# Patient Record
Sex: Female | Born: 2000
Health system: Southern US, Community
[De-identification: ages and names within clinical notes are randomized; demographics above are authoritative.]

## PROBLEM LIST (undated history)

## (undated) DIAGNOSIS — D649 Anemia, unspecified: Secondary | ICD-10-CM

## (undated) HISTORY — DX: Anemia, unspecified: D64.9

---
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
## 2021-01-01 ENCOUNTER — Emergency Department (HOSPITAL_BASED_OUTPATIENT_CLINIC_OR_DEPARTMENT_OTHER): Payer: Managed Care, Other (non HMO)

## 2021-01-01 ENCOUNTER — Emergency Department (HOSPITAL_BASED_OUTPATIENT_CLINIC_OR_DEPARTMENT_OTHER)
Admission: EM | Admit: 2021-01-01 | Discharge: 2021-01-01 | Disposition: A | Discharge status: Home / Self Care | Payer: Managed Care, Other (non HMO) | Attending: Student | Admitting: Student

## 2021-01-01 ENCOUNTER — Encounter (HOSPITAL_BASED_OUTPATIENT_CLINIC_OR_DEPARTMENT_OTHER): Payer: Self-pay

## 2021-01-01 ENCOUNTER — Other Ambulatory Visit: Payer: Self-pay

## 2021-01-01 DIAGNOSIS — R112 Nausea with vomiting, unspecified: Secondary | ICD-10-CM | POA: Insufficient documentation

## 2021-01-01 DIAGNOSIS — N9489 Other specified conditions associated with female genital organs and menstrual cycle: Secondary | ICD-10-CM | POA: Diagnosis not present

## 2021-01-01 DIAGNOSIS — R1030 Lower abdominal pain, unspecified: Secondary | ICD-10-CM | POA: Diagnosis not present

## 2021-01-01 DIAGNOSIS — Z20822 Contact with and (suspected) exposure to covid-19: Secondary | ICD-10-CM | POA: Insufficient documentation

## 2021-01-01 DIAGNOSIS — R509 Fever, unspecified: Secondary | ICD-10-CM | POA: Diagnosis not present

## 2021-01-01 DIAGNOSIS — R519 Headache, unspecified: Secondary | ICD-10-CM | POA: Diagnosis not present

## 2021-01-01 LAB — URINALYSIS, ROUTINE W REFLEX MICROSCOPIC
Bilirubin Urine: NEGATIVE
Glucose, UA: NEGATIVE mg/dL
Hgb urine dipstick: NEGATIVE
Ketones, ur: NEGATIVE mg/dL
Leukocytes,Ua: NEGATIVE
Nitrite: NEGATIVE
Protein, ur: NEGATIVE mg/dL
Specific Gravity, Urine: 1.015 (ref 1.005–1.030)
pH: 6.5 (ref 5.0–8.0)

## 2021-01-01 LAB — CBC WITH DIFFERENTIAL/PLATELET
Abs Immature Granulocytes: 0.02 10*3/uL (ref 0.00–0.07)
Basophils Absolute: 0 10*3/uL (ref 0.0–0.1)
Basophils Relative: 0 %
Eosinophils Absolute: 0 10*3/uL (ref 0.0–0.5)
Eosinophils Relative: 0 %
HCT: 34 % — ABNORMAL LOW (ref 36.0–46.0)
Hemoglobin: 10.7 g/dL — ABNORMAL LOW (ref 12.0–15.0)
Immature Granulocytes: 0 %
Lymphocytes Relative: 38 %
Lymphs Abs: 2.9 10*3/uL (ref 0.7–4.0)
MCH: 23.6 pg — ABNORMAL LOW (ref 26.0–34.0)
MCHC: 31.5 g/dL (ref 30.0–36.0)
MCV: 75.1 fL — ABNORMAL LOW (ref 80.0–100.0)
Monocytes Absolute: 0.4 10*3/uL (ref 0.1–1.0)
Monocytes Relative: 5 %
Neutro Abs: 4.4 10*3/uL (ref 1.7–7.7)
Neutrophils Relative %: 57 %
Platelets: 171 10*3/uL (ref 150–400)
RBC: 4.53 MIL/uL (ref 3.87–5.11)
RDW: 13.6 % (ref 11.5–15.5)
WBC: 7.7 10*3/uL (ref 4.0–10.5)
nRBC: 0 % (ref 0.0–0.2)

## 2021-01-01 LAB — COMPREHENSIVE METABOLIC PANEL
ALT: 15 U/L (ref 0–44)
AST: 17 U/L (ref 15–41)
Albumin: 5 g/dL (ref 3.5–5.0)
Alkaline Phosphatase: 57 U/L (ref 38–126)
Anion gap: 8 (ref 5–15)
BUN: 13 mg/dL (ref 6–20)
CO2: 26 mmol/L (ref 22–32)
Calcium: 9.7 mg/dL (ref 8.9–10.3)
Chloride: 105 mmol/L (ref 98–111)
Creatinine, Ser: 0.55 mg/dL (ref 0.44–1.00)
GFR, Estimated: >60 mL/min (ref >60)
Glucose, Bld: 92 mg/dL (ref 70–99)
Potassium: 4 mmol/L (ref 3.5–5.1)
Sodium: 139 mmol/L (ref 135–145)
Total Bilirubin: 0.3 mg/dL (ref 0.3–1.2)
Total Protein: 8.3 g/dL — ABNORMAL HIGH (ref 6.5–8.1)

## 2021-01-01 LAB — RESP PANEL BY RT-PCR (FLU A&B, COVID) ARPGX2
Influenza A by PCR: NEGATIVE
Influenza B by PCR: NEGATIVE
SARS Coronavirus 2 by RT PCR: NEGATIVE

## 2021-01-01 LAB — HCG, SERUM, QUALITATIVE: Preg, Serum: NEGATIVE

## 2021-01-01 MED ORDER — IOHEXOL 350 MG/ML SOLN
75.0000 mL | Freq: Once | INTRAVENOUS | Status: AC | PRN
  Administered 2021-01-01: 75 mL via INTRAVENOUS

## 2021-01-01 MED ORDER — DIPHENHYDRAMINE HCL 50 MG/ML IJ SOLN
25.0000 mg | Freq: Once | INTRAMUSCULAR | Status: AC
  Administered 2021-01-01: 25 mg via INTRAVENOUS
  Filled 2021-01-01: qty 1

## 2021-01-01 MED ORDER — SODIUM CHLORIDE 0.9 % IV BOLUS
1000.0000 mL | Freq: Once | INTRAVENOUS | Status: AC
  Administered 2021-01-01: 1000 mL via INTRAVENOUS

## 2021-01-01 MED ORDER — PROMETHAZINE HCL 25 MG/ML IJ SOLN
INTRAMUSCULAR | Status: AC
  Filled 2021-01-01: qty 1

## 2021-01-01 MED ORDER — SODIUM CHLORIDE 0.9 % IV SOLN
12.5000 mg | Freq: Four times a day (QID) | INTRAVENOUS | Status: DC | PRN
  Administered 2021-01-01: 12.5 mg via INTRAVENOUS
  Filled 2021-01-01: qty 0.5

## 2021-01-01 NOTE — Discharge Instructions (Addendum)
Your laboratory results are within normal limits today.  You had a CT of your head and neck which did not show any intracranial pathology.  Follow-up with your primary care physician as needed.

## 2021-01-01 NOTE — ED Notes (Signed)
Per EMS:  pt from work, pt has HA since Monday, worse last night.  Sensitive to light, no history of migraine.  Pain is intermittent,  all over her head.  Some N/V.

## 2021-01-01 NOTE — ED Provider Notes (Addendum)
[facility] EMERGENCY DEPARTMENT Provider Note   CSN: [id number] Arrival date & time: 01/01/21  1002     History Chief Complaint  Patient presents with   Headache   Emesis    Karina Horne is a 20 y.o. female.  20 y.o female with no medical history presents to the ED with a chief complaint of headache which has been ongoing for the past 4 days.  Describes the headaches occur when she arrives at work however worsen when she arrived at home. The headache is described as a generalized throbbing that has been on/off. She tried taking some medication "I took it at work I don't know the name", which did not help with symptoms.  In addition, she does report 2 episodes of nonbilious, nonbloody emesis.  She is also endorsing some lower abdominal pain, along with some nausea.  Denies any diarrhea.  She denies any urinary symptoms, vaginal discharge. Of note, patient does report her father does have a history of aneurysms.  Last menstrual period around September, does have a history of irregular periods.  Currently sexually active with no birth control.     The history is provided by the patient.  Headache Pain location:  Generalized Quality:  Sharp Radiates to:  Does not radiate Associated symptoms: abdominal pain, fever, nausea and vomiting   Emesis Associated symptoms: abdominal pain, fever and headaches   Associated symptoms: no chills       History reviewed. No pertinent past medical history.  Patient Active Problem List   Diagnosis Date Noted   Non-intractable vomiting 01/26/2019   Periumbilical abdominal pain 01/26/2019   Weight loss 01/26/2019   Oral contraceptive use 04/24/2018   Elevated blood-pressure reading without diagnosis of hypertension 05/21/2017   Irregular menstrual bleeding 05/21/2017    History reviewed. No pertinent surgical history.   OB History     Gravida  0   Para  0   Term  0   Preterm  0   AB  0   Living  0      SAB  0    IAB  0   Ectopic  0   Multiple  0   Live Births  0           No family history on file.  Social History   Tobacco Use   Smoking status: Never   Smokeless tobacco: Never  Vaping Use   Vaping Use: Every day   Substances: Nicotine  Substance Use Topics   Alcohol use: Not Currently   Drug use: Yes    Frequency: 7.0 times per week    Types: Marijuana    Home Medications Prior to Admission medications   Medication Sig Start Date End Date Taking? Authorizing Provider  clindamycin-benzoyl peroxide (BENZACLIN) gel APPLY TOPICALLY TO AFFECTED AREA ONCE DAILY. 07/23/18   [provider]    Allergies    Amoxicillin  Review of Systems   Review of Systems  Constitutional:  Positive for fever. Negative for chills.  Respiratory:  Negative for shortness of breath.   Gastrointestinal:  Positive for abdominal pain, nausea and vomiting.  Genitourinary:  Negative for hematuria, vaginal bleeding and vaginal discharge.  Neurological:  Positive for headaches.   Physical Exam Updated Vital Signs BP 108/82 (BP Location: Right Arm)   Pulse 92   Temp 98.4 F (36.9 C) (Oral)   Resp 16   Ht 5\' 3"  (1.6 m)   Wt 49.9 kg   SpO2 99%   BMI  19.49 kg/m   Physical Exam Vitals and nursing note reviewed.  Constitutional:      Appearance: She is well-developed. She is not ill-appearing.  HENT:     Head: Normocephalic and atraumatic.  Eyes:     Extraocular Movements: Extraocular movements intact.  Cardiovascular:     Rate and Rhythm: Normal rate.  Pulmonary:     Effort: Pulmonary effort is normal.     Breath sounds: No wheezing.  Abdominal:     Palpations: Abdomen is soft.     Tenderness: There is no abdominal tenderness.  Musculoskeletal:     Cervical back: Normal range of motion and neck supple.  Skin:    General: Skin is warm.  Neurological:     Mental Status: She is alert and oriented to person, place, and time.     Comments: Alert, oriented, thought content  appropriate. Speech fluent without evidence of aphasia. Able to follow 2 step commands without difficulty.  Cranial Nerves:  II:  Peripheral visual fields grossly normal, pupils, round, reactive to light III,IV, VI: ptosis not present, extra-ocular motions intact bilaterally  V,VII: smile symmetric, facial light touch sensation equal VIII: hearing grossly normal bilaterally  IX,X: midline uvula rise  XI: bilateral shoulder shrug equal and strong XII: midline tongue extension  Motor:  5/5 in upper and lower extremities bilaterally including strong and equal grip strength and dorsiflexion/plantar flexion Sensory: light touch normal in all extremities.  Cerebellar: normal finger-to-nose with bilateral upper extremities, pronator drift negative Gait: normal gait and balance      ED Results / Procedures / Treatments   Labs (all labs ordered are listed, but only abnormal results are displayed) Labs Reviewed  CBC WITH DIFFERENTIAL/PLATELET - Abnormal; Notable for the following components:      Result Value   Hemoglobin 10.7 (*)    HCT 34.0 (*)    MCV 75.1 (*)    MCH 23.6 (*)    All other components within normal limits  COMPREHENSIVE METABOLIC PANEL - Abnormal; Notable for the following components:   Total Protein 8.3 (*)    All other components within normal limits  RESP PANEL BY RT-PCR (FLU A&B, COVID) ARPGX2  HCG, SERUM, QUALITATIVE  URINALYSIS, ROUTINE W REFLEX MICROSCOPIC    EKG None  Radiology CT ANGIO HEAD NECK W WO CM  Result Date: 01/01/2021 CLINICAL DATA:  Headache, family history of aneurysms EXAM: CT ANGIOGRAPHY HEAD AND NECK TECHNIQUE: Multidetector CT imaging of the head and neck was performed using the standard protocol during bolus administration of intravenous contrast. Multiplanar CT image reconstructions and MIPs were obtained to evaluate the vascular anatomy. Carotid stenosis measurements (when applicable) are obtained utilizing NASCET criteria, using the  distal internal carotid diameter as the denominator. CONTRAST:  52mL OMNIPAQUE IOHEXOL 350 MG/ML SOLN COMPARISON:  None. FINDINGS: CT HEAD FINDINGS Brain: There is no acute intracranial hemorrhage, extra-axial fluid collection, or acute infarct. Parenchymal volume is normal. The ventricles are normal in size. There is no mass lesion. There is no midline shift. Vascular: No hyperdense vessel or unexpected calcification. Skull: Normal. Negative for fracture or focal lesion. Sinuses and orbits: The paranasal sinuses are clear. The globes and orbits are unremarkable. Review of the MIP images confirms the above findings CTA NECK FINDINGS Aortic arch: Normal. Right carotid system: The right common, internal, and external carotid arteries are patent, without hemodynamically significant stenosis, occlusion, dissection, or aneurysm. There is a tonsillar loop of the right internal carotid artery. Left carotid system: The left common,  internal, and external carotid arteries are patent, without hemodynamically significant stenosis, occlusion, dissection, or aneurysm. Vertebral arteries: The right vertebral artery is dominant, a normal variant. The vertebral arteries are patent, without hemodynamically significant stenosis, occlusion, dissection, or aneurysm. Skeleton: The bones are normal. Other neck: The soft tissues are normal. Upper chest: The lung apices are clear. Review of the MIP images confirms the above findings CTA HEAD FINDINGS Anterior circulation: The intracranial ICAs are patent. The bilateral MCAs are patent. The bilateral ACAs are patent. There is no aneurysm. Posterior circulation: The bilateral V4 segments are patent. The basilar artery is patent. The bilateral PCAs are patent. The left posterior communicating artery is not definitely identified Venous sinuses: Patent. Anatomic variants: As above. Review of the MIP images confirms the above findings IMPRESSION: 1. No acute intracranial pathology. 2. Normal  vasculature of the head and neck; no significant stenosis, occlusion, dissection, or aneurysm. Electronically Signed   By: Lesia Hausen M.D.   On: 01/01/2021 16:22    Procedures Procedures   Medications Ordered in ED Medications  promethazine (PHENERGAN) 12.5 mg in sodium chloride 0.9 % 50 mL IVPB (0 mg Intravenous Stopped 01/01/21 1541)  promethazine (PHENERGAN) 25 MG/ML injection (has no administration in time range)  sodium chloride 0.9 % bolus 1,000 mL (0 mLs Intravenous Stopped 01/01/21 1639)  diphenhydrAMINE (BENADRYL) injection 25 mg (25 mg Intravenous Given 01/01/21 1520)  iohexol (OMNIPAQUE) 350 MG/ML injection 75 mL (75 mLs Intravenous Contrast Given 01/01/21 1539)    ED Course  I have reviewed the triage vital signs and the nursing notes.  Pertinent labs & imaging results that were available during my care of the patient were reviewed by me and considered in my medical decision making (see chart for details).    MDM Rules/Calculators/A&P   Patient presents to the ED with a chief complaint of headache of sudden onset in the last few days, describing as a throbbing sensation throughout her whole head, does have a family history of aneurysms.  Reports no improvement with over-the-counter medication.  In addition she endorses vomiting, had 2 episodes of this prior to arrival.  Along with a low-grade fever.  She did obtain respiratory panel on triage, negative for influenza, negative for COVID-19.  In addition, she does report generalized abdominal pain but mostly along the lower abdomen without any urinary or vaginal discharge.  UA is without any nitrites, no leukocytes or signs of infection.  Beta hCG is negative.  CMP without any electrolyte derangement, creatinine levels within normal limits.  CT Angio head and neck showed:  1. No acute intracranial pathology.  2. Normal vasculature of the head and neck; no significant stenosis,  occlusion, dissection, or aneurysm.          Patient is requesting discharge at this time as she reports resolution in headache.  I discussed with her that her CT findings.  Of note, patient does report she recently got placed on doxycycline, or her symptoms began after she started taking this medication.  She has been tolerating p.o. after fluids and has had some resolution in her headache.  She is currently on doxycycline for a history of chlamydia, I did talk to patient about having medication changed by PCP.  She is agreeable of this at this time. Encouraged to follow-up with PCP as needed.  Patient is agreeable of discharge at this time.   Portions of this note were generated with Scientist, clinical (histocompatibility and immunogenetics). Dictation errors may occur despite best attempts at proofreading.  Final Clinical Impression(s) / ED Diagnoses Final diagnoses:  Nausea and vomiting, unspecified vomiting type    Rx / DC Orders ED Discharge Orders     None        Claude Manges, PA-C 01/01/21 1655    Claude Manges, PA-C 01/01/21 1702    [place], [place], MD 01/02/21 [phone]

## 2021-01-01 NOTE — ED Triage Notes (Addendum)
Pt arrives [facility] from work with c/o headache since Monday states she has not taken anything at home for headache. Pt also c/o nausea and vomiting starting on Tuesday. Also body aches and chills starting today.

## 2021-01-13 ENCOUNTER — Ambulatory Visit: Payer: Managed Care, Other (non HMO) | Admitting: Family Medicine

## 2021-01-13 ENCOUNTER — Other Ambulatory Visit: Payer: Self-pay

## 2021-01-13 VITALS — BP 110/64 | HR 97 | Temp 97.8°F | Ht 63.0 in | Wt 108.6 lb

## 2021-01-13 DIAGNOSIS — Z113 Encounter for screening for infections with a predominantly sexual mode of transmission: Secondary | ICD-10-CM | POA: Diagnosis not present

## 2021-01-13 DIAGNOSIS — N926 Irregular menstruation, unspecified: Secondary | ICD-10-CM

## 2021-01-13 DIAGNOSIS — D509 Iron deficiency anemia, unspecified: Secondary | ICD-10-CM | POA: Diagnosis not present

## 2021-01-13 MED ORDER — FERROUS SULFATE 325 (65 FE) MG PO TBEC: 325.0000 mg | DELAYED_RELEASE_TABLET | Freq: Two times a day (BID) | ORAL | 3 refills | Status: DC

## 2021-01-13 NOTE — Progress Notes (Signed)
[facility] [address] [place] [address] Dept: [phone] Dept Fax: [phone]  Office Visit  Subjective:    Patient ID: Karina Horne, female    DOB: October 04, 2000, 20 y.o..   MRN: [id number]  Chief Complaint  Patient presents with   Acute Visit    Wants STD testing.  Also c/o having abnormal periods and low iron.      History of Present Illness:  Patient is in today requesting screening for STDs. Karina Horne has recently found evidence that her boyfriend was not exclusively having sex with her. They had reached a point where they were no longer using condoms. She has now boken up with her boyfriend, but requests screening to know about possible infection.  Karina Horne had recently been seen by Karina Horne. She was found to be anemic on her blood evaluation. She is not sure about what iron to take for this.  Karina Horne does note an issue with menstrual irregularity. She does not feel that she ahs heavy periods. She notes she was previously on COCs, but that her periods remained irregular. Earlier this year, she went on Depo-Provera. She also did not see improvement in her menstrual cycles with this, so now has been more than 3 months since her last shot. She notes her grandmotehr told her that she needs to have a period every month.  Past Medical History: Patient Active Problem List   Diagnosis Date Noted   Non-intractable vomiting [id number]   Periumbilical abdominal pain 01/26/2019   Weight loss 01/26/2019   Oral contraceptive use 04/24/2018   Elevated blood-pressure reading without diagnosis of hypertension 05/21/2017   Irregular menstrual bleeding 05/21/2017   No past surgical history on file.  No family history on file.  Outpatient Medications Prior to Visit  Medication Sig Dispense Refill   clindamycin-benzoyl peroxide (BENZACLIN) gel APPLY TOPICALLY TO AFFECTED AREA ONCE DAILY.     No  facility-administered medications prior to visit.   Allergies  Allergen Reactions   Amoxicillin     Objective:   Today's Vitals   01/13/21 1507  BP: 110/64  Pulse: 97  Temp: 97.8 F (36.6 C)  TempSrc: Temporal  SpO2: 98%  Weight: 108 lb 9.6 oz (49.3 kg)  Height: 5\' 3"  (1.6 m)   Body mass index is 19.24 kg/m.   General: Well developed, well nourished. No acute distress. Psych: Alert and oriented x3. Normal mood and affect.  Health Maintenance Due  Topic Date Due   HPV VACCINES (1 - 2-dose series) Never done   CHLAMYDIA SCREENING  Never done   COVID-19 Vaccine (3 - Booster for Pfizer series) 09/25/2019   Lab Results CBC Latest Ref Rng & Units 01/01/2021 11/26/2020 01/17/2019  WBC 4.0 - 10.5 K/uL 7.7 4.7 9.0  Hemoglobin 12.0 - 15.0 g/dL 10.7(L) 10.3(L) 10.1(L)  Hematocrit 36.0 - 46.0 % 34.0(L) 32.5(L) 33.1(L)  Platelets 150 - 400 K/uL 171 140.0(L) 171     Assessment & Plan:   1. Screen for STD (sexually transmitted disease) I will order screening labs for STDs. We did discuss the recommendation to repeat an HIV test in 3-6 months to be sure it truly is negative.  - Chlamydia/GC NAA, Confirmation - Hepatitis B surface antigen - HIV Antibody (routine testing w rflx) - RPR  2. Microcytic anemia Karina Horne has persistent microcytic anemia. I will check her iron profile. I will also prescribe iron replacement. I recommend she reassess this in 6 weeks. If not  improving, she may need an iron infusion.  - Iron, TIBC and Ferritin Panel - ferrous sulfate 325 (65 FE) MG EC tablet; Take 1 tablet (325 mg total) by mouth 2 (two) times daily with a meal.  Dispense: 60 tablet; Refill: 3  3. Menstrual irregularity We discussed various options for birth control and how they might impact her menses. I did discuss the option of continuous birth control, possibly having a menses every 3 months. This could reduce her menstrual blood loss and help her correct her anemia. She will give  some thoguht to this and follow-up with Karina Horne.  Loyola Mast, MD

## 2021-01-14 LAB — HEPATITIS B SURFACE ANTIGEN: Hepatitis B Surface Ag: NONREACTIVE

## 2021-01-14 LAB — RPR: RPR Ser Ql: NONREACTIVE

## 2021-01-14 LAB — IRON,TIBC AND FERRITIN PANEL
%SAT: 30 % (calc) (ref 15–45)
Ferritin: 109 ng/mL (ref 16–154)
Iron: 99 ug/dL (ref 27–164)
TIBC: 325 mcg/dL (calc) (ref 271–448)

## 2021-01-14 LAB — HIV ANTIBODY (ROUTINE TESTING W REFLEX): HIV 1&2 Ab, 4th Generation: NONREACTIVE

## 2021-01-16 LAB — CHLAMYDIA/GC NAA, CONFIRMATION
Chlamydia trachomatis, NAA: NEGATIVE
Neisseria gonorrhoeae, NAA: NEGATIVE

## 2021-02-24 ENCOUNTER — Ambulatory Visit: Payer: Managed Care, Other (non HMO) | Admitting: Nurse Practitioner

## 2021-03-25 ENCOUNTER — Ambulatory Visit: Payer: Managed Care, Other (non HMO) | Admitting: Nurse Practitioner

## 2021-03-25 ENCOUNTER — Encounter: Payer: Self-pay | Admitting: Nurse Practitioner

## 2021-03-25 ENCOUNTER — Other Ambulatory Visit: Payer: Self-pay

## 2021-03-25 VITALS — BP 98/60 | HR 80 | Temp 97.3°F | Ht 63.0 in | Wt 110.8 lb

## 2021-03-25 DIAGNOSIS — R5382 Chronic fatigue, unspecified: Secondary | ICD-10-CM

## 2021-03-25 DIAGNOSIS — D509 Iron deficiency anemia, unspecified: Secondary | ICD-10-CM

## 2021-03-25 MED ORDER — FERROUS SULFATE 325 (65 FE) MG PO TBEC: 325.0000 mg | DELAYED_RELEASE_TABLET | Freq: Every day | ORAL | 1 refills | Status: AC

## 2021-03-25 NOTE — Assessment & Plan Note (Signed)
No use of ferrous sulfate at this time. She is concerned about cost and need for medication Denies any menorrhagia: LMP 03/10/2021, cycle every 28-30days, last 4days (heavy for first 2days, change every 3hrs with regular pad), no clots, mild dysmenorrhea on first 2days which resolves with ibuprofen. CBC Latest Ref Rng & Units 01/01/2021 11/26/2020 01/17/2019  WBC 4.0 - 10.5 K/uL 7.7 4.7 9.0  Hemoglobin 12.0 - 15.0 g/dL 10.7(L) 10.3(L) 10.1(L)  Hematocrit 36.0 - 46.0 % 34.0(L) 32.5(L) 33.1(L)  Platelets 150 - 400 K/uL 171 140.0(L) 171   Iron/TIBC/Ferritin/ %Sat    Component Value Date/Time   IRON 99 01/13/2021 1528   TIBC 325 01/13/2021 1528   FERRITIN 109 01/13/2021 1528   IRONPCTSAT 30 01/13/2021 1528   Advise to use prenatal with iron 1tab daily or ferrous sulfate 1tab daily Repeat cbc and iron panel, check SS and thalessemia panel, since normal iron panel.

## 2021-03-25 NOTE — Patient Instructions (Signed)
Schedule lab appt for repeat anemia panel in 7month.  Resume iron supplement 1tab daily

## 2021-03-25 NOTE — Progress Notes (Signed)
Subjective:  Patient ID: Karina Horne, female    DOB: 2000-09-22  Age: 21 y.o. MRN: [id number]  CC: Follow-up (6 week f/u on Anemia/Pt states she has stopped her iron medication and wants to discuss this today. )   HPI  Microcytic anemia No use of ferrous sulfate at this time. She is concerned about cost and need for medication Denies any menorrhagia: LMP 03/10/2021, cycle every 28-30days, last 4days (heavy for first 2days, change every 3hrs with regular pad), no clots, mild dysmenorrhea on first 2days which resolves with ibuprofen. CBC Latest Ref Rng & Units 01/01/2021 11/26/2020 01/17/2019  WBC 4.0 - 10.5 K/uL 7.7 4.7 9.0  Hemoglobin 12.0 - 15.0 g/dL 10.7(L) 10.3(L) 10.1(L)  Hematocrit 36.0 - 46.0 % 34.0(L) 32.5(L) 33.1(L)  Platelets 150 - 400 K/uL 171 140.0(L) 171   Iron/TIBC/Ferritin/ %Sat    Component Value Date/Time   IRON 99 01/13/2021 1528   TIBC 325 01/13/2021 1528   FERRITIN 109 01/13/2021 1528   IRONPCTSAT 30 01/13/2021 1528   Advise to use prenatal with iron 1tab daily or ferrous sulfate 1tab daily Repeat cbc and iron panel, check SS and thalessemia panel, since normal iron panel.  She also reports persistent fatigue despite adequate sleep. She sleepns 6-7hrs, feels rested, Minimal caffeine intake (2cups of ice tea)  Wt Readings from Last 3 Encounters:  03/25/21 110 lb 12.8 oz (50.3 kg) (17 %, Z= -0.94)*  01/13/21 108 lb 9.6 oz (49.3 kg) (14 %, Z= -1.07)*  01/01/21 110 lb (49.9 kg) (17 %, Z= -0.97)*   * Growth percentiles are based on CDC (Girls, 2-20 Years) data.    Reviewed past Medical, Social and Family history today.  Outpatient Medications Prior to Visit  Medication Sig Dispense Refill   clindamycin-benzoyl peroxide (BENZACLIN) gel APPLY TOPICALLY TO AFFECTED AREA ONCE DAILY.     ferrous sulfate 325 (65 FE) MG EC tablet Take 1 tablet (325 mg total) by mouth 2 (two) times daily with a meal. 60 tablet 3   No facility-administered medications prior to  visit.    ROS See HPI  Objective:  BP 98/60 (BP Location: Left Arm, Patient Position: Sitting, Cuff Size: Normal)    Pulse 80    Temp (!) 97.3 F (36.3 C) (Temporal)    Ht 5\' 3"  (1.6 m)    Wt 110 lb 12.8 oz (50.3 kg)    SpO2 98%    BMI 19.63 kg/m   Physical Exam Cardiovascular:     Rate and Rhythm: Normal rate.     Pulses: Normal pulses.  Pulmonary:     Effort: Pulmonary effort is normal.  Neurological:     Mental Status: She is alert and oriented to person, place, and time.    Assessment & Plan:  This visit occurred during the SARS-CoV-2 public health emergency.  Safety protocols were in place, including screening questions prior to the visit, additional usage of staff PPE, and extensive cleaning of exam room while observing appropriate contact time as indicated for disinfecting solutions.   Karina Horne was seen today for follow-up.  Diagnoses and all orders for this visit:  Chronic fatigue -     TSH; Future  Microcytic anemia -     ferrous sulfate 325 (65 FE) MG EC tablet; Take 1 tablet (325 mg total) by mouth daily with breakfast. -     CBC w/Diff; Future -     Iron, TIBC and Ferritin Panel; Future -     Sickle cell screen; Future -  Thalassemia and Hemoglobinopathy Comprehensive Evaluation; Future   Problem List Items Addressed This Visit       Other   Microcytic anemia    No use of ferrous sulfate at this time. She is concerned about cost and need for medication Denies any menorrhagia: LMP 03/10/2021, cycle every 28-30days, last 4days (heavy for first 2days, change every 3hrs with regular pad), no clots, mild dysmenorrhea on first 2days which resolves with ibuprofen. CBC Latest Ref Rng & Units 01/01/2021 11/26/2020 01/17/2019  WBC 4.0 - 10.5 K/uL 7.7 4.7 9.0  Hemoglobin 12.0 - 15.0 g/dL 10.7(L) 10.3(L) 10.1(L)  Hematocrit 36.0 - 46.0 % 34.0(L) 32.5(L) 33.1(L)  Platelets 150 - 400 K/uL 171 140.0(L) 171   Iron/TIBC/Ferritin/ %Sat    Component Value Date/Time    IRON 99 01/13/2021 1528   TIBC 325 01/13/2021 1528   FERRITIN 109 01/13/2021 1528   IRONPCTSAT 30 01/13/2021 1528   Advise to use prenatal with iron 1tab daily or ferrous sulfate 1tab daily Repeat cbc and iron panel, check SS and thalessemia panel, since normal iron panel.      Relevant Medications   ferrous sulfate 325 (65 FE) MG EC tablet   Other Relevant Orders   CBC w/Diff   Iron, TIBC and Ferritin Panel   Sickle cell screen   Thalassemia and Hemoglobinopathy Comprehensive Evaluation   Other Visit Diagnoses     Chronic fatigue    -  Primary   Relevant Orders   TSH       Follow-up: Return in about 8 months (around 11/23/2021) for CPE (fasting).  Wilfred Lacy, NP

## 2021-05-29 ENCOUNTER — Ambulatory Visit: Payer: Managed Care, Other (non HMO) | Admitting: Nurse Practitioner

## 2021-05-29 ENCOUNTER — Other Ambulatory Visit: Payer: Self-pay

## 2021-05-29 ENCOUNTER — Encounter: Payer: Self-pay | Admitting: Nurse Practitioner

## 2021-05-29 ENCOUNTER — Other Ambulatory Visit (HOSPITAL_COMMUNITY)
Admission: RE | Admit: 2021-05-29 | Discharge: 2021-05-29 | Disposition: A | Discharge status: Home / Self Care | Payer: Managed Care, Other (non HMO) | Source: Ambulatory Visit | Attending: Nurse Practitioner | Admitting: Nurse Practitioner

## 2021-05-29 VITALS — BP 100/62 | HR 111 | Temp 97.1°F | Ht 64.75 in | Wt 107.4 lb

## 2021-05-29 DIAGNOSIS — N898 Other specified noninflammatory disorders of vagina: Secondary | ICD-10-CM | POA: Insufficient documentation

## 2021-05-29 DIAGNOSIS — N760 Acute vaginitis: Secondary | ICD-10-CM

## 2021-05-29 DIAGNOSIS — R300 Dysuria: Secondary | ICD-10-CM

## 2021-05-29 LAB — POCT URINALYSIS DIPSTICK
Bilirubin, UA: NEGATIVE
Blood, UA: NEGATIVE
Glucose, UA: NEGATIVE
Ketones, UA: NEGATIVE
Nitrite, UA: NEGATIVE
Protein, UA: POSITIVE — AB
Spec Grav, UA: 1.015 (ref 1.010–1.025)
Urobilinogen, UA: NEGATIVE E.U./dL — AB
pH, UA: 6 (ref 5.0–8.0)

## 2021-05-29 NOTE — Patient Instructions (Signed)
Maintain adequate oral hydration ?Ok to use AZO from over the counter for dysuria ?You will be contacted with lab results ?

## 2021-05-29 NOTE — Progress Notes (Signed)
? ?Acute Office Visit ? ?Subjective:  ? ? Patient ID: Karina Horne, female    DOB: [birth year], 21 y.o.   MRN: [id number] ? ?Chief Complaint  ?Patient presents with  ? Acute Visit  ?  Pt c/o burning sensation, odor, and pain when urinating x 3-4 days. Denies any discharge ?Pt has not tried any otc medications.   ? ?HPI ?Patient is in today for dysuria and vaginal odor. Onset 4days ago. No abnormal discharge, but odor is prominent during intercourse. No dyspareunia. Onset of dysuria after intercourse. ?No back pain, no ABD/pelvic pain, no fever ?No hx of recurrent UTI or STI ? ?Outpatient Medications Prior to Visit  ?Medication Sig  ? ferrous sulfate 325 (65 FE) MG EC tablet Take 1 tablet (325 mg total) by mouth daily with breakfast.  ? [DISCONTINUED] clindamycin-benzoyl peroxide (BENZACLIN) gel APPLY TOPICALLY TO AFFECTED AREA ONCE DAILY. (Patient not taking: Reported on 05/29/2021)  ? ?No facility-administered medications prior to visit.  ? ?Reviewed past medical and social history.  ?Review of Systems Per HPI ? ?   ?Objective:  ?  ?Physical Exam ?Exam conducted with a chaperone present.  ?Abdominal:  ?   Hernia: There is no hernia in the left inguinal area or right inguinal area.  ?Genitourinary: ?   General: Normal vulva.  ?   Exam position: Lithotomy position.  ?   Labia:     ?   Right: No rash, tenderness or lesion.     ?   Left: No rash, tenderness or lesion.   ?   Vagina: Vaginal discharge present. No erythema or tenderness.  ?   Cervix: Discharge present. No cervical motion tenderness, friability, lesion or erythema.  ?Lymphadenopathy:  ?   Lower Body: No right inguinal adenopathy. No left inguinal adenopathy.  ?Neurological:  ?   Mental Status: She is alert.  ? ?BP 100/62 (BP Location: Left Arm, Patient Position: Sitting, Cuff Size: Normal)   Pulse (!) 111   Temp (!) 97.1 ?F (36.2 ?C) (Temporal)   Ht 5' 4.75" (1.645 m)   Wt 107 lb 6.4 oz (48.7 kg)   SpO2 99%   BMI 18.01 kg/m?  ? ? ?Results for orders  placed or performed in visit on 05/29/21  ?POCT urinalysis dipstick  ?Result Value Ref Range  ? Color, UA yellow   ? Clarity, UA cloudy   ? Glucose, UA Negative Negative  ? Bilirubin, UA negative   ? Ketones, UA negative   ? Spec Grav, UA 1.015 1.010 - 1.025  ? Blood, UA negative   ? pH, UA 6.0 5.0 - 8.0  ? Protein, UA Positive (A) Negative  ? Urobilinogen, UA negative (A) 0.2 or 1.0 E.U./dL  ? Nitrite, UA negative   ? Leukocytes, UA Small (1+) (A) Negative  ? Appearance    ? Odor    ? ?   ?Assessment & Plan:  ? ?Problem List Items Addressed This Visit   ?None ?Visit Diagnoses   ? ? Dysuria    -  Primary  ? Relevant Orders  ? POCT urinalysis dipstick (Completed)  ? Urine Culture  ? Vaginal odor      ? Relevant Orders  ? Cervicovaginal ancillary only  ? ?  ? ?Maintain adequate oral hydration ?Ok to use AZO from over the counter for dysuria ?You will be contacted with lab results ? ?No orders of the defined types were placed in this encounter. ? ?Return if symptoms worsen or fail to improve. ? ? ? ?  Wilfred Lacy, NP ?

## 2021-05-31 ENCOUNTER — Encounter: Payer: Self-pay | Admitting: Nurse Practitioner

## 2021-05-31 LAB — URINE CULTURE
MICRO NUMBER:: 13176755
SPECIMEN QUALITY:: ADEQUATE

## 2021-06-01 LAB — CERVICOVAGINAL ANCILLARY ONLY
Bacterial Vaginitis (gardnerella): POSITIVE — AB
Candida Glabrata: NEGATIVE
Candida Vaginitis: POSITIVE — AB
Chlamydia: NEGATIVE
Comment: NEGATIVE
Comment: NEGATIVE
Comment: NEGATIVE
Comment: NEGATIVE
Comment: NEGATIVE
Comment: NORMAL
Neisseria Gonorrhea: NEGATIVE
Trichomonas: NEGATIVE

## 2021-06-01 MED ORDER — FLUCONAZOLE 150 MG PO TABS: 150.0000 mg | ORAL_TABLET | Freq: Every day | ORAL | 0 refills | Status: DC

## 2021-06-01 MED ORDER — METRONIDAZOLE 0.75 % VA GEL: 1.0000 | Freq: Every day | VAGINAL | 0 refills | Status: DC

## 2021-06-01 NOTE — Addendum Note (Signed)
Addended by: Alysia Penna L on: 06/01/2021 01:11 PM ? ? Modules accepted: Orders ? ?

## 2021-06-02 ENCOUNTER — Telehealth: Payer: Self-pay | Admitting: Nurse Practitioner

## 2021-06-02 DIAGNOSIS — N3000 Acute cystitis without hematuria: Secondary | ICD-10-CM

## 2021-06-02 MED ORDER — CEPHALEXIN 250 MG PO CAPS: 250.0000 mg | ORAL_CAPSULE | Freq: Four times a day (QID) | ORAL | 0 refills | Status: DC

## 2021-06-02 NOTE — Telephone Encounter (Signed)
-----   Message from Arcelia Jew, [place] sent at 06/02/2021  9:17 AM EDT ----- ?Per patient she breaks out in hives with amoxicillin ?

## 2021-06-02 NOTE — Telephone Encounter (Signed)
Keflex sent

## 2021-06-19 ENCOUNTER — Encounter: Payer: Self-pay | Admitting: Nurse Practitioner

## 2021-09-09 ENCOUNTER — Telehealth: Payer: Self-pay | Admitting: Nurse Practitioner

## 2021-09-09 NOTE — Telephone Encounter (Signed)
Pt is wanting to transfer her care from [facility] to [facility]. She is needing more appointments after 2. If this Is OK just let me know and I can schedule this. Pt's [phone]

## 2021-09-14 ENCOUNTER — Encounter: Payer: Self-pay | Admitting: Family Medicine

## 2021-09-14 ENCOUNTER — Ambulatory Visit: Payer: Managed Care, Other (non HMO) | Admitting: Family Medicine

## 2021-09-14 VITALS — BP 108/66 | HR 95 | Temp 97.5°F | Ht 64.0 in | Wt 109.0 lb

## 2021-09-14 DIAGNOSIS — Z113 Encounter for screening for infections with a predominantly sexual mode of transmission: Secondary | ICD-10-CM | POA: Diagnosis not present

## 2021-09-14 DIAGNOSIS — N3000 Acute cystitis without hematuria: Secondary | ICD-10-CM | POA: Diagnosis not present

## 2021-09-14 LAB — POCT URINALYSIS DIPSTICK
Bilirubin, UA: NEGATIVE
Blood, UA: NEGATIVE
Glucose, UA: NEGATIVE
Ketones, UA: 5
Protein, UA: POSITIVE — AB
Spec Grav, UA: >=1.03 — AB (ref 1.010–1.025)
Urobilinogen, UA: NEGATIVE E.U./dL — AB
pH, UA: 6 (ref 5.0–8.0)

## 2021-09-14 MED ORDER — SULFAMETHOXAZOLE-TRIMETHOPRIM 800-160 MG PO TABS: 1.0000 | ORAL_TABLET | Freq: Two times a day (BID) | ORAL | 0 refills | Status: DC

## 2021-09-14 NOTE — Telephone Encounter (Signed)
I spoke with pt and she will give [place] a call back to schedule a TOC with Lauren.

## 2021-09-14 NOTE — Progress Notes (Signed)
  [facility] [address] [place] [address] Dept: [phone] Dept Fax: [phone]  Office Visit  Subjective:    Patient ID: Karina Horne, female    DOB: 05-26-2000, 21 y.o..   MRN: [id number]  Chief Complaint  Patient presents with   Urinary Tract Infection    Possible UTI, urine frequency, bladder pressure, urine dark in color with odor, STD testing. Symptoms x 4 days.     History of Present Illness:  Patient is in today for with a 4-day history of urinary frequency, a urine odor, and increased bladder pressure. 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  [name] requests STD testing. She is in a stable long-term relationship. She notes whenever she has GU symptoms it worries her about whether she could have an STD. She is not having any vaginal discharge and has not had any genital sores. She notes she had a needlestick exposure at work 2 days ago. She had an HIV test drawn then.  Past Medical History: Patient Active Problem List   Diagnosis Date Noted   Microcytic anemia 03/25/2021   Periumbilical abdominal pain 01/26/2019   No past surgical history on file.  No family history on file.  Outpatient Medications Prior to Visit  Medication Sig Dispense Refill   ferrous sulfate 325 (65 FE) MG EC tablet Take 1 tablet (325 mg total) by mouth daily with breakfast. 90 tablet 1   cephALEXin (KEFLEX) 250 MG capsule Take 1 capsule (250 mg total) by mouth 4 (four) times daily. (Patient not taking: Reported on 09/14/2021) 12 capsule 0   fluconazole (DIFLUCAN) 150 MG tablet Take 1 tablet (150 mg total) by mouth daily. Take second tab 3days apart from first tab (Patient not taking: Reported on 09/14/2021) 2 tablet 0   metroNIDAZOLE (METROGEL VAGINAL) 0.75 % vaginal gel Place 1 Applicatorful vaginally at bedtime. (Patient not taking: Reported on 09/14/2021) 70 g 0    No facility-administered medications prior to visit.   Allergies  Allergen Reactions   Amoxicillin Hives   Objective:   Today's Vitals   09/14/21 0816  BP: 108/66  Pulse: 95  Temp: (!) 97.5 F (36.4 C)  TempSrc: Temporal  Weight: 109 lb (49.4 kg)  Height: 5\' 4"  (1.626 m)   Body mass index is 18.71 kg/m.   General: Well developed, well nourished. No acute distress. Psych: Alert and oriented. Normal mood and affect.  Health Maintenance Due  Topic Date Due   COVID-19 Vaccine (3 - Pfizer series) 09/25/2019   Lab Results Urine dipstick shows negative for red blood cells, glucose, urobilinogen, positive for nitrites, leukocytes, protein, ketones.  Assessment & Plan:   1. Acute cystitis without hematuria Urine appears to indicate an infection. I will send the urine for culture and treat her with a 3-day course of Septra. She can use OTC Azo as needed for any urinary discomfort.  - POCT Urinalysis Dipstick - sulfamethoxazole-trimethoprim (BACTRIM DS) 800-160 MG tablet; Take 1 tablet by mouth 2 (two) times daily.  Dispense: 6 tablet; Refill: 0  2. Screening for STD (sexually transmitted disease) I will check a GC and Chlamydia. I did discuss the importance of her repeating her HIV in 12 weeks to assure negativity of this.  - Chlamydia/GC NAA, Confirmation - Urine Culture   Return if symptoms worsen or fail to improve.   09/27/2019, MD

## 2021-09-16 LAB — URINE CULTURE
MICRO NUMBER:: 13625184
SPECIMEN QUALITY:: ADEQUATE

## 2021-09-16 LAB — CHLAMYDIA/GC NAA, CONFIRMATION
Chlamydia trachomatis, NAA: NEGATIVE
Neisseria gonorrhoeae, NAA: NEGATIVE

## 2021-09-18 ENCOUNTER — Telehealth: Payer: Self-pay | Admitting: Nurse Practitioner

## 2021-09-18 DIAGNOSIS — R8290 Unspecified abnormal findings in urine: Secondary | ICD-10-CM

## 2021-09-18 NOTE — Telephone Encounter (Signed)
See note

## 2021-09-18 NOTE — Telephone Encounter (Signed)
Lab appt scheduled.

## 2021-09-21 ENCOUNTER — Other Ambulatory Visit: Payer: Managed Care, Other (non HMO)

## 2021-09-21 DIAGNOSIS — R8290 Unspecified abnormal findings in urine: Secondary | ICD-10-CM

## 2021-09-22 LAB — URINALYSIS W MICROSCOPIC + REFLEX CULTURE
Bacteria, UA: NONE SEEN /HPF
Bilirubin Urine: NEGATIVE
Glucose, UA: NEGATIVE
Hgb urine dipstick: NEGATIVE
Hyaline Cast: NONE SEEN /LPF
Ketones, ur: NEGATIVE
Leukocyte Esterase: NEGATIVE
Nitrites, Initial: NEGATIVE
Protein, ur: NEGATIVE
RBC / HPF: NONE SEEN /HPF (ref 0–2)
Specific Gravity, Urine: 1.007 (ref 1.001–1.035)
WBC, UA: NONE SEEN /HPF (ref 0–5)
pH: 7.5 (ref 5.0–8.0)

## 2021-09-22 LAB — NO CULTURE INDICATED

## 2021-10-30 NOTE — Telephone Encounter (Signed)
Na

## 2021-11-25 NOTE — Progress Notes (Unsigned)
   Acute Office Visit  Subjective:     Patient ID: Karina Horne, female    DOB: 04-15-2000, 21 y.o.   MRN: [id number]  No chief complaint on file.   HPI Patient is in today for bladder pressure.   URINARY SYMPTOMS  Dysuria: {Blank single:19197::"yes","no","burning"} Urinary frequency: {Blank single:19197::"yes","no"} Urgency: {Blank single:19197::"yes","no"} Small volume voids: {Blank single:19197::"yes","no"} Symptom severity: {Blank single:19197::"yes","no"} Urinary incontinence: {Blank single:19197::"yes","no"} Foul odor: {Blank single:19197::"yes","no"} Hematuria: {Blank single:19197::"yes","no"} Abdominal pain: {Blank single:19197::"yes","no"} Back pain: {Blank single:19197::"yes","no"} Suprapubic pain/pressure: {Blank single:19197::"yes","no"} Flank pain: {Blank single:19197::"yes","no"} Fever:  {Blank multiple:19196::"yes","no","subjective","low grade"} Vomiting: {Blank single:19197::"yes","no"} Relief with cranberry juice: {Blank single:19197::"yes","no"} Relief with pyridium: {Blank single:19197::"yes","no"} Status: better/worse/stable Previous urinary tract infection: {Blank single:19197::"yes","no"} Recurrent urinary tract infection: {Blank single:19197::"yes","no"} Sexual activity: No sexually active/monogomous/practicing safe sex History of sexually transmitted disease: {Blank single:19197::"yes","no"} Penile discharge: {Blank single:19197::"yes","no"} Treatments attempted: {Blank multiple:19196::"none","antibiotics","pyridium","cranberry","increasing fluids"}    ROS      Objective:    There were no vitals taken for this visit. {Vitals History (Optional):23777}  Physical Exam  No results found for any visits on 11/26/21.      Assessment & Plan:   Problem List Items Addressed This Visit   None   No orders of the defined types were placed in this encounter.   No follow-ups on file.  Charyl Dancer, NP

## 2021-11-26 ENCOUNTER — Ambulatory Visit: Payer: Managed Care, Other (non HMO) | Admitting: Nurse Practitioner

## 2021-11-26 ENCOUNTER — Other Ambulatory Visit (HOSPITAL_COMMUNITY)
Admission: RE | Admit: 2021-11-26 | Discharge: 2021-11-26 | Disposition: A | Discharge status: Home / Self Care | Payer: Managed Care, Other (non HMO) | Source: Ambulatory Visit | Attending: Nurse Practitioner | Admitting: Nurse Practitioner

## 2021-11-26 ENCOUNTER — Encounter: Payer: Self-pay | Admitting: Nurse Practitioner

## 2021-11-26 VITALS — BP 102/78 | HR 94 | Temp 97.1°F | Wt 109.6 lb

## 2021-11-26 DIAGNOSIS — N898 Other specified noninflammatory disorders of vagina: Secondary | ICD-10-CM

## 2021-11-26 DIAGNOSIS — R300 Dysuria: Secondary | ICD-10-CM

## 2021-11-26 LAB — POCT URINALYSIS DIPSTICK
Bilirubin, UA: NEGATIVE
Blood, UA: NEGATIVE
Glucose, UA: NEGATIVE
Ketones, UA: NEGATIVE
Leukocytes, UA: NEGATIVE
Nitrite, UA: NEGATIVE
Protein, UA: NEGATIVE
Spec Grav, UA: 1.02 (ref 1.010–1.025)
Urobilinogen, UA: NEGATIVE E.U./dL — AB
pH, UA: 6 (ref 5.0–8.0)

## 2021-11-26 NOTE — Patient Instructions (Signed)
It was great to see you!  Keep drinking plenty of fluids. Try to limit coke, spicy foods, and caffeine. Urinate after sexual activity.   We are sending your urine for culture and also did a vaginal swab. We will let you know the results.   Let's follow-up if your symptoms worsen or don't improve.   Take care,  Vance Peper, NP

## 2021-11-27 LAB — URINE CULTURE
MICRO NUMBER:: 13950085
SPECIMEN QUALITY:: ADEQUATE

## 2021-11-27 LAB — CERVICOVAGINAL ANCILLARY ONLY
Bacterial Vaginitis (gardnerella): NEGATIVE
Candida Glabrata: NEGATIVE
Candida Vaginitis: NEGATIVE
Chlamydia: NEGATIVE
Comment: NEGATIVE
Comment: NEGATIVE
Comment: NEGATIVE
Comment: NEGATIVE
Comment: NEGATIVE
Comment: NORMAL
Neisseria Gonorrhea: NEGATIVE
Trichomonas: NEGATIVE

## 2021-11-30 ENCOUNTER — Encounter: Payer: Managed Care, Other (non HMO) | Admitting: Nurse Practitioner

## 2021-12-01 ENCOUNTER — Encounter: Payer: Self-pay | Admitting: Nurse Practitioner

## 2021-12-01 ENCOUNTER — Telehealth: Payer: Managed Care, Other (non HMO) | Admitting: Nurse Practitioner

## 2021-12-01 ENCOUNTER — Encounter: Payer: Managed Care, Other (non HMO) | Admitting: Nurse Practitioner

## 2021-12-01 ENCOUNTER — Ambulatory Visit (INDEPENDENT_AMBULATORY_CARE_PROVIDER_SITE_OTHER): Payer: Managed Care, Other (non HMO)

## 2021-12-01 DIAGNOSIS — Z23 Encounter for immunization: Secondary | ICD-10-CM | POA: Diagnosis not present

## 2021-12-01 NOTE — Progress Notes (Signed)
Pt tolerated well

## 2021-12-01 NOTE — Telephone Encounter (Signed)
Called and spoke to pt. Explained to pt different ways she can help flush her urinary tract and prevent further possible infections. Told pt to keep appt and if symptoms do not get better to f/u and we will see her sooner. Pt voiced understanding. Sw, cma

## 2021-12-01 NOTE — Telephone Encounter (Signed)
Caller Name: Angeliyah Kirkey Call back phone #: [phone]  Reason for Call: Please call pt to go over results from appt on 9/21. She is officially transferring care to [facility] 10/11

## 2021-12-02 NOTE — Progress Notes (Signed)
She was not seen. Error in scheduling. This encounter was created in error - please disregard.

## 2021-12-16 ENCOUNTER — Encounter: Payer: Self-pay | Admitting: Nurse Practitioner

## 2021-12-16 ENCOUNTER — Ambulatory Visit: Payer: Managed Care, Other (non HMO) | Admitting: Nurse Practitioner

## 2021-12-16 VITALS — BP 105/63 | HR 90 | Temp 97.5°F | Ht 64.5 in | Wt 111.2 lb

## 2021-12-16 DIAGNOSIS — Z3009 Encounter for other general counseling and advice on contraception: Secondary | ICD-10-CM

## 2021-12-16 DIAGNOSIS — D509 Iron deficiency anemia, unspecified: Secondary | ICD-10-CM

## 2021-12-16 DIAGNOSIS — R3989 Other symptoms and signs involving the genitourinary system: Secondary | ICD-10-CM

## 2021-12-16 LAB — POCT URINALYSIS DIPSTICK
Bilirubin, UA: NEGATIVE
Blood, UA: NEGATIVE
Glucose, UA: NEGATIVE
Ketones, UA: NEGATIVE
Nitrite, UA: NEGATIVE
Protein, UA: POSITIVE — AB
Spec Grav, UA: 1.015 (ref 1.010–1.025)
Urobilinogen, UA: NEGATIVE E.U./dL — AB
pH, UA: 6.5 (ref 5.0–8.0)

## 2021-12-16 NOTE — Assessment & Plan Note (Signed)
Ongoing now for over a month. U/A showed 1+ leukocytes, will add on urine culture, however it was negative last visit. With ongoing symptoms, will place referral to urology.

## 2021-12-16 NOTE — Assessment & Plan Note (Signed)
Continue iron supplement daily. Will check labs next visit.

## 2021-12-16 NOTE — Patient Instructions (Signed)
It was great to see you!  I have placed a referral to urology, they will call to schedule.   If you decide on a birth control, you can send me a message.   Let's follow-up in 3 months, sooner if you have concerns.  If a referral was placed today, you will be contacted for an appointment. Please note that routine referrals can sometimes take up to 3-4 weeks to process. Please call our office if you haven't heard anything after this time frame.  Take care,  Vance Peper, NP

## 2021-12-16 NOTE — Assessment & Plan Note (Signed)
Discussed various options of birth control. She would like to think about this and reach out if she makes a decision on one she would like to start.

## 2021-12-16 NOTE — Progress Notes (Signed)
New Patient Visit  BP 105/63 (BP Location: Left Arm, Patient Position: Sitting, Cuff Size: Normal)   Pulse 90   Temp (!) 97.5 F (36.4 C) (Temporal)   Ht 5' 4.5" (1.638 m)   Wt 111 lb 3.2 oz (50.4 kg)   SpO2 99%   BMI 18.79 kg/m    Subjective:    Patient ID: Karina Horne, female    DOB: [birth year], 21 y.o.   MRN: [id number]  CC: Chief Complaint  Patient presents with   Establish Care    TOC  Est care Bladder presser urine has an odor frequent urination x1 month     HPI: Karina Horne is a 21 y.o. female presents to transfer care to a new provider.  Introduced to Publishing rights managernurse practitioner role and practice setting.  All questions answered.  Discussed provider/patient relationship and expectations.  She is still having bladder pressure. She states that if she holds her urine, it causes pain. She notices an odor at times. She has been increasing her fluids and cut back on her sodas. She denies nausea, back pain, and fevers.  Depression and Anxiety Screen Done:     12/16/2021   10:57 AM 12/16/2021   10:31 AM 11/26/2020   10:12 AM 11/26/2020    9:41 AM  Depression screen PHQ 2/9  Decreased Interest 0 0 0 0  Down, Depressed, Hopeless 0 0 0 0  PHQ - 2 Score 0 0 0 0      12/16/2021   10:57 AM  GAD 7 : Generalized Anxiety Score  Nervous, Anxious, on Edge 0  Control/stop worrying 0  Worry too much - different things 0  Trouble relaxing 0  Restless 0  Easily annoyed or irritable 0  Afraid - awful might happen 0  Total GAD 7 Score 0  Anxiety Difficulty Not difficult at all    Past Medical History:  Diagnosis Date   Anemia     History reviewed. No pertinent surgical history.  Family History  Problem Relation Age of Onset   Healthy Mother    Healthy Father      Social History   Tobacco Use   Smoking status: Never   Smokeless tobacco: Never  Vaping Use   Vaping Use: Every day   Substances: Nicotine  Substance Use Topics   Alcohol use: Not Currently    Drug use: Yes    Frequency: 7.0 times per week    Types: Marijuana    Current Outpatient Medications on File Prior to Visit  Medication Sig Dispense Refill   ferrous sulfate 325 (65 FE) MG EC tablet Take 1 tablet (325 mg total) by mouth daily with breakfast. 90 tablet 1   No current facility-administered medications on file prior to visit.     Review of Systems  Constitutional:  Positive for fatigue. Negative for fever.  HENT: Negative.    Eyes: Negative.   Respiratory: Negative.    Cardiovascular: Negative.   Gastrointestinal:  Positive for constipation (at times) and diarrhea (at times). Negative for abdominal pain.  Genitourinary:  Positive for frequency and urgency. Negative for dysuria.       Bladder pressure  Musculoskeletal: Negative.   Skin: Negative.   Neurological: Negative.   Psychiatric/Behavioral: Negative.         Objective:    BP 105/63 (BP Location: Left Arm, Patient Position: Sitting, Cuff Size: Normal)   Pulse 90   Temp (!) 97.5 F (36.4 C) (Temporal)   Ht 5' 4.5" (1.638 m)  Wt 111 lb 3.2 oz (50.4 kg)   SpO2 99%   BMI 18.79 kg/m   Wt Readings from Last 3 Encounters:  12/16/21 111 lb 3.2 oz (50.4 kg)  12/01/21 111 lb 6.4 oz (50.5 kg) (17 %, Z= -0.95)*  11/26/21 109 lb 9.6 oz (49.7 kg) (14 %, Z= -1.07)*   * Growth percentiles are based on CDC (Girls, 2-20 Years) data.    BP Readings from Last 3 Encounters:  12/16/21 105/63  12/01/21 110/70  11/26/21 102/78    Physical Exam Vitals and nursing note reviewed.  Constitutional:      General: She is not in acute distress.    Appearance: Normal appearance.  HENT:     Head: Normocephalic.     Right Ear: Tympanic membrane, ear canal and external ear normal.     Left Ear: Tympanic membrane, ear canal and external ear normal.  Eyes:     Conjunctiva/sclera: Conjunctivae normal.  Cardiovascular:     Rate and Rhythm: Normal rate and regular rhythm.     Pulses: Normal pulses.     Heart sounds: Normal  heart sounds.  Pulmonary:     Effort: Pulmonary effort is normal.     Breath sounds: Normal breath sounds.  Abdominal:     General: There is no distension.     Palpations: Abdomen is soft.     Tenderness: There is no abdominal tenderness. There is no right CVA tenderness or left CVA tenderness.  Musculoskeletal:     Cervical back: Normal range of motion and neck supple. No tenderness.     Right lower leg: No edema.     Left lower leg: No edema.  Lymphadenopathy:     Cervical: No cervical adenopathy.  Skin:    General: Skin is warm.  Neurological:     General: No focal deficit present.     Mental Status: She is alert and oriented to person, place, and time.  Psychiatric:        Mood and Affect: Mood normal.        Behavior: Behavior normal.        Thought Content: Thought content normal.        Judgment: Judgment normal.        Assessment & Plan:   Problem List Items Addressed This Visit       Other   Microcytic anemia    Continue iron supplement daily. Will check labs next visit.       Sensation of pressure in bladder area - Primary    Ongoing now for over a month. U/A showed 1+ leukocytes, will add on urine culture, however it was negative last visit. With ongoing symptoms, will place referral to urology.       Relevant Orders   POCT urinalysis dipstick (Completed)   Urine Culture   Ambulatory referral to Urology   Birth control counseling    Discussed various options of birth control. She would like to think about this and reach out if she makes a decision on one she would like to start.         Follow up plan: Return in about 3 months (around 03/18/2022) for CPE.

## 2021-12-19 LAB — URINE CULTURE
MICRO NUMBER:: 14037343
SPECIMEN QUALITY:: ADEQUATE

## 2021-12-20 MED ORDER — NITROFURANTOIN MONOHYD MACRO 100 MG PO CAPS: 100.0000 mg | ORAL_CAPSULE | Freq: Two times a day (BID) | ORAL | 0 refills | Status: DC

## 2021-12-20 NOTE — Addendum Note (Signed)
Addended by: Vance Peper A on: 12/20/2021 05:22 PM   Modules accepted: Orders

## 2021-12-28 ENCOUNTER — Telehealth: Payer: Self-pay | Admitting: Nurse Practitioner

## 2021-12-28 NOTE — Telephone Encounter (Signed)
Called and ldvm

## 2021-12-28 NOTE — Telephone Encounter (Signed)
Caller Name: Montgomery Rothlisberger Call back phone #: [phone]  Reason for Call: Positive pregnancy test, recently prescribed Nitrofurantoin earlier this month. Is it okay to continue with med? She has an appointment 10/25 to address other questions

## 2021-12-30 ENCOUNTER — Ambulatory Visit: Payer: Managed Care, Other (non HMO) | Admitting: Nurse Practitioner

## 2021-12-30 ENCOUNTER — Encounter: Payer: Self-pay | Admitting: Nurse Practitioner

## 2021-12-30 VITALS — BP 104/66 | HR 78 | Wt 113.0 lb

## 2021-12-30 DIAGNOSIS — N926 Irregular menstruation, unspecified: Secondary | ICD-10-CM

## 2021-12-30 NOTE — Patient Instructions (Signed)
It was great to see you!  We are checking your blood today.   [facility]. You can start a prenatal vitamin daily.   Let's follow-up if you have any questions or concerns.   Take care,  Vance Peper, NP  Safe Medications in Pregnancy   Acne: Benzoyl Peroxide Salicylic Acid  Backache/Headache: Tylenol: 2 regular strength every 4 hours OR              2 Extra strength every 6 hours  Colds/Coughs/Allergies: Benadryl (alcohol free) 25 mg every 6 hours as needed Breath right strips Claritin Cepacol throat lozenges Chloraseptic throat spray Cold-Eeze- up to three times per day Cough drops, alcohol free Flonase (by prescription only) Guaifenesin Mucinex Robitussin DM (plain only, alcohol free) Saline nasal spray/drops Sudafed (pseudoephedrine) & Actifed ** use only after [redacted] weeks gestation and if you do not have high blood pressure Tylenol Vicks Vaporub Zinc lozenges Zyrtec   Constipation: Colace Ducolax suppositories Fleet enema Glycerin suppositories Metamucil Milk of magnesia Miralax Senokot Smooth move tea  Diarrhea: Kaopectate Imodium A-D  *NO pepto Bismol  Hemorrhoids: Anusol Anusol HC Preparation H Tucks  Indigestion: Tums Maalox Mylanta Zantac  Pepcid  Insomnia: Benadryl (alcohol free) 25mg  every 6 hours as needed Tylenol PM Unisom, no Gelcaps  Leg Cramps: Tums MagGel  Nausea/Vomiting:  Bonine Dramamine Emetrol Ginger extract Sea bands Meclizine  Nausea medication to take during pregnancy:  Unisom (doxylamine succinate 25 mg tablets) Take one tablet daily at bedtime. If symptoms are not adequately controlled, the dose can be increased to a maximum recommended dose of two tablets daily (1/2 tablet in the morning, 1/2 tablet mid-afternoon and one at bedtime). Vitamin B6 100mg  tablets. Take one tablet twice a day (up to 200 mg per day).  Skin Rashes: Aveeno products Benadryl cream or  25mg  every 6 hours as needed Calamine Lotion 1% cortisone cream  Yeast infection: Gyne-lotrimin 7 Monistat 7   **If taking multiple medications, please check labels to avoid duplicating the same active ingredients **take medication as directed on the label ** Do not exceed 4000 mg of tylenol in 24 hours **Do not take medications that contain aspirin or ibuprofen

## 2021-12-30 NOTE — Progress Notes (Signed)
   Acute Office Visit  Subjective:     Patient ID: Karina Horne, female    DOB: 02-Dec-2000, 21 y.o.   MRN: [id number]  Chief Complaint  Patient presents with   Follow-up    Pt teste pos for pregnacy 12/26/21, requesting blood work to confirm    HPI Patient is in today for last period 11/04/21. She states that her periods are irregular and sometimes she will go months wihtout having one. She had 3 positive pregnancy tests at home.  She endorses slight nausea, breast tenderness, and some lower back pain.  She would like a blood test to help show how far along she is in her pregnancy.  ROS See pertinent positives and negatives per HPI.     Objective:    BP 104/66   Pulse 78   Wt 113 lb (51.3 kg)   SpO2 98%   BMI 19.10 kg/m    Physical Exam Vitals and nursing note reviewed.  Constitutional:      General: She is not in acute distress.    Appearance: Normal appearance.  HENT:     Head: Normocephalic.  Eyes:     Conjunctiva/sclera: Conjunctivae normal.  Pulmonary:     Effort: Pulmonary effort is normal.  Musculoskeletal:     Cervical back: Normal range of motion.  Skin:    General: Skin is warm.  Neurological:     General: No focal deficit present.     Mental Status: She is alert and oriented to person, place, and time.  Psychiatric:        Mood and Affect: Mood normal.        Behavior: Behavior normal.        Thought Content: Thought content normal.        Judgment: Judgment normal.      Assessment & Plan:   Problem List Items Addressed This Visit       Other   Missed period - Primary    She had a missed period, although her menstrual periods are regular.  She had 3 positive home pregnancy test.  She is not sure at this time if she would like to keep the pregnancy or terminate.  We will check beta-hCG today.  Encouraged [facility] to [facility] and schedule an appointment as soon as possible.  If she would like to keep the pregnancy, recommend she start  a prenatal vitamin daily and gave her a list of medications that are safe to take over-the-counter pregnancy.      Relevant Orders   Beta hCG quant (ref lab)    No orders of the defined types were placed in this encounter.   No follow-ups on file.  Charyl Dancer, NP

## 2021-12-30 NOTE — Assessment & Plan Note (Signed)
She had a missed period, although her menstrual periods are regular.  She had 3 positive home pregnancy test.  She is not sure at this time if she would like to keep the pregnancy or terminate.  We will check beta-hCG today.  Encouraged [facility] to call [facility] and schedule an appointment as soon as possible.  If she would like to keep the pregnancy, recommend she start a prenatal vitamin daily and gave her a list of medications that are safe to take over-the-counter pregnancy.

## 2021-12-31 LAB — BETA HCG QUANT (REF LAB): hCG Quant: 6560 m[IU]/mL

## 2022-01-07 DIAGNOSIS — Z3401 Encounter for supervision of normal first pregnancy, first trimester: Secondary | ICD-10-CM | POA: Diagnosis not present

## 2022-02-09 ENCOUNTER — Ambulatory Visit: Payer: Managed Care, Other (non HMO) | Admitting: Urology

## 2022-02-09 NOTE — Progress Notes (Deleted)
   Assessment: 1. History of UTI   2. Sensation of pressure in bladder area      Plan: ***  Chief Complaint: No chief complaint on file.   History of Present Illness:  Tanara Turvey is a 21 y.o. female who is seen in consultation from [facility], Jake Church, NP for evaluation of bladder pressure. She was initially seen at urgent care in July 2023 with bladder pressure.  Urine culture at that time grew 10-49 K ESBL E. coli.  She was treated with Bactrim despite resistance on the culture.  Repeat urinalysis was unremarkable.  She presented to in with bladder pressure and frequency in September 2023.  Urine culture grew <10K colonies.  Her symptoms persisted and she was again seen in October 2023.  Urine culture grew 50-100 K E. coli.  She was treated with Macrobid x 5 days.  Recent pregnancy test was positive. Past Medical History:  Past Medical History:  Diagnosis Date   Anemia     Past Surgical History:  No past surgical history on file.  Allergies:  Allergies  Allergen Reactions   Amoxicillin Hives    Family History:  Family History  Problem Relation Age of Onset   Healthy Mother    Healthy Father     Social History:  Social History   Tobacco Use   Smoking status: Never   Smokeless tobacco: Never  Vaping Use   Vaping Use: Every day   Substances: Nicotine  Substance Use Topics   Alcohol use: Not Currently   Drug use: Yes    Frequency: 7.0 times per week    Types: Marijuana    Review of symptoms:  Constitutional:  Negative for unexplained weight loss, night sweats, fever, chills ENT:  Negative for nose bleeds, sinus pain, painful swallowing CV:  Negative for chest pain, shortness of breath, exercise intolerance, palpitations, loss of consciousness Resp:  Negative for cough, wheezing, shortness of breath GI:  Negative for nausea, vomiting, diarrhea, bloody stools GU:  Positives noted in HPI; otherwise negative for gross hematuria, dysuria, urinary  incontinence Neuro:  Negative for seizures, poor balance, limb weakness, slurred speech Psych:  Negative for lack of energy, depression, anxiety Endocrine:  Negative for polydipsia, polyuria, symptoms of hypoglycemia (dizziness, hunger, sweating) Hematologic:  Negative for anemia, purpura, petechia, prolonged or excessive bleeding, use of anticoagulants  Allergic:  Negative for difficulty breathing or choking as a result of exposure to anything; no shellfish allergy; no allergic response (rash/itch) to materials, foods  Physical exam: There were no vitals taken for this visit. GENERAL APPEARANCE:  Well appearing, well developed, well nourished, NAD HEENT: Atraumatic, Normocephalic, oropharynx clear. NECK: Supple without lymphadenopathy or thyromegaly. LUNGS: Clear to auscultation bilaterally. HEART: Regular Rate and Rhythm without murmurs, gallops, or rubs. ABDOMEN: Soft, non-tender, No Masses. EXTREMITIES: Moves all extremities well.  Without clubbing, cyanosis, or edema. NEUROLOGIC:  Alert and oriented x 3, normal gait, CN II-XII grossly intact.  MENTAL STATUS:  Appropriate. BACK:  Non-tender to palpation.  No CVAT SKIN:  Warm, dry and intact.    Results: U/A:

## 2022-04-13 DIAGNOSIS — Z363 Encounter for antenatal screening for malformations: Secondary | ICD-10-CM | POA: Diagnosis not present

## 2022-05-01 DIAGNOSIS — R109 Unspecified abdominal pain: Secondary | ICD-10-CM | POA: Diagnosis not present

## 2022-05-01 DIAGNOSIS — Z3A22 22 weeks gestation of pregnancy: Secondary | ICD-10-CM | POA: Diagnosis not present

## 2022-05-01 DIAGNOSIS — O26892 Other specified pregnancy related conditions, second trimester: Secondary | ICD-10-CM | POA: Diagnosis not present

## 2022-05-18 DIAGNOSIS — O43192 Other malformation of placenta, second trimester: Secondary | ICD-10-CM | POA: Diagnosis not present

## 2022-06-14 ENCOUNTER — Telehealth: Payer: Self-pay

## 2022-06-14 NOTE — Telephone Encounter (Signed)
Called patient to schedule New OB appointment after receiving referral from [facility] with patient and she is planning to continue care at [facility].

## 2022-08-04 DIAGNOSIS — Z3A00 Weeks of gestation of pregnancy not specified: Secondary | ICD-10-CM | POA: Diagnosis not present

## 2022-08-04 DIAGNOSIS — O26899 Other specified pregnancy related conditions, unspecified trimester: Secondary | ICD-10-CM | POA: Diagnosis not present

## 2022-08-04 DIAGNOSIS — O99019 Anemia complicating pregnancy, unspecified trimester: Secondary | ICD-10-CM | POA: Diagnosis not present

## 2022-08-05 DIAGNOSIS — O99019 Anemia complicating pregnancy, unspecified trimester: Secondary | ICD-10-CM | POA: Diagnosis not present

## 2022-08-05 DIAGNOSIS — Z3A00 Weeks of gestation of pregnancy not specified: Secondary | ICD-10-CM | POA: Diagnosis not present

## 2022-08-05 DIAGNOSIS — O26899 Other specified pregnancy related conditions, unspecified trimester: Secondary | ICD-10-CM | POA: Diagnosis not present

## 2022-08-24 IMAGING — CT CT ANGIO HEAD-NECK (W OR W/O PERF)
1 of 11 series · 5 of 33 positions shown · IV contrast (Omnipaque)
Comparison: None.

CLINICAL DATA: Headache, family history of aneurysms

EXAM:
CT ANGIOGRAPHY HEAD AND NECK
TECHNIQUE: Multidetector CT imaging of the head and neck was performed using
the standard protocol during bolus administration of intravenous
contrast. Multiplanar CT image reconstructions and MIPs were
obtained to evaluate the vascular anatomy. Carotid stenosis
measurements (when applicable) are obtained utilizing NASCET
criteria, using the distal internal carotid diameter as the
denominator.
CONTRAST:  75mL OMNIPAQUE IOHEXOL 350 MG/ML SOLN

[Series 13: axial thin · axial · 0.41mm/px · z∈[+1054,+1285]mm · 5 of 347 slices shown]
[im 58/347  soft-tissue]
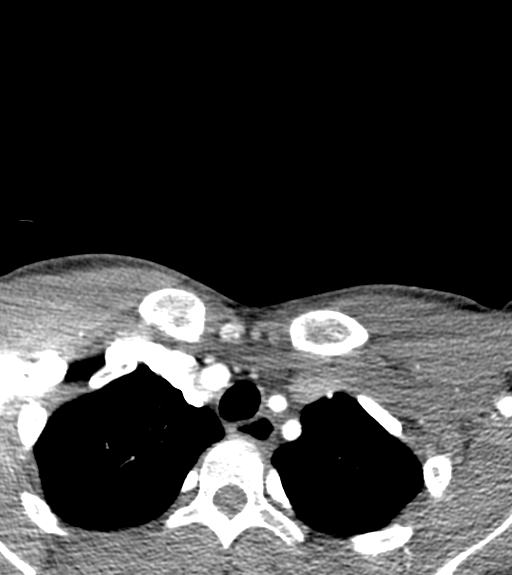
[im 116/347  bone]
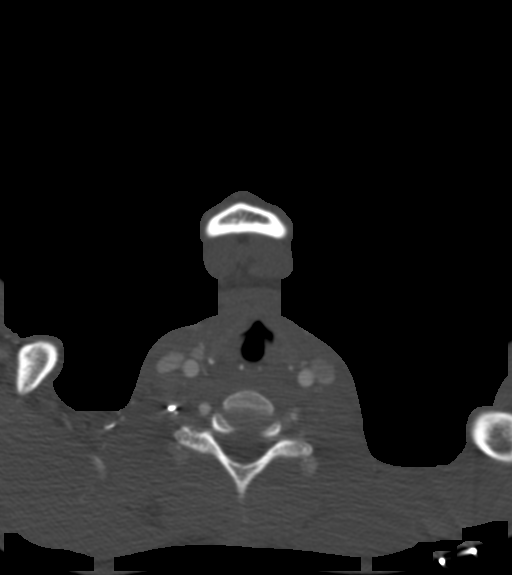
[im 174/347  soft-tissue]
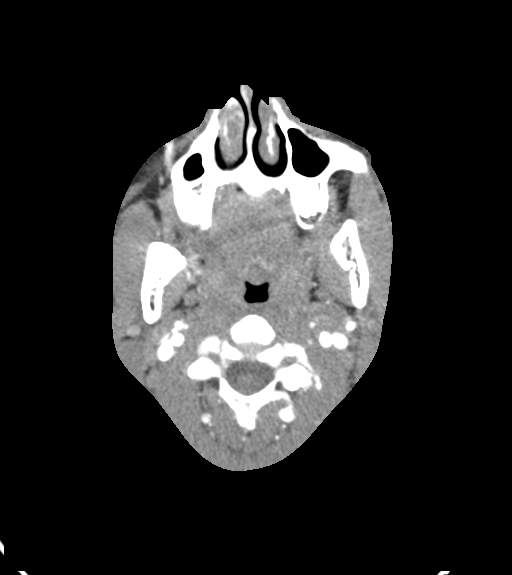
[im 231/347  bone]
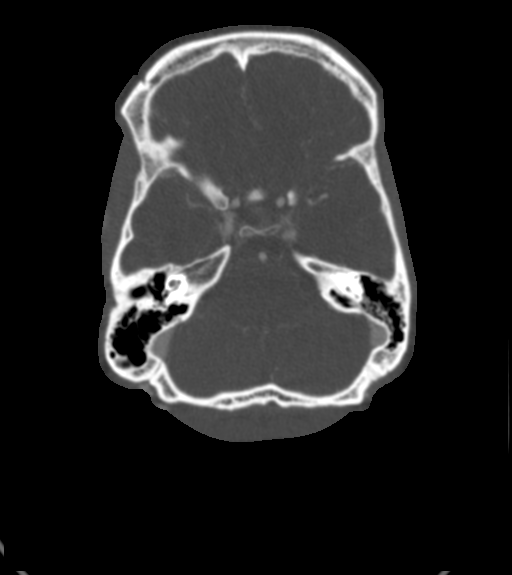
[im 289/347  soft-tissue]
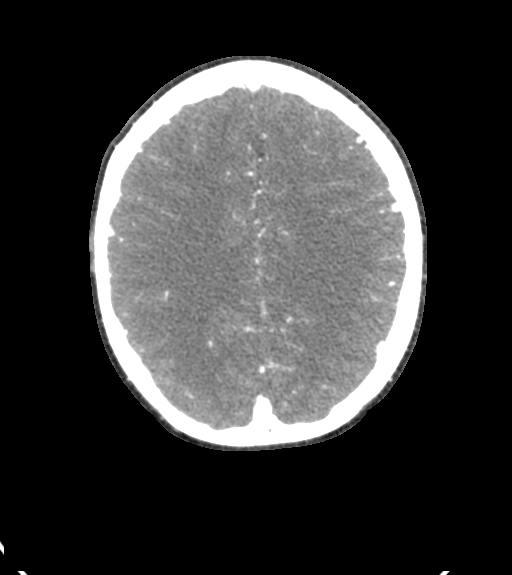

[5 of 33 positions shown; findings below may reference images not displayed]

FINDINGS: CT HEAD FINDINGS

Brain: There is no acute intracranial hemorrhage, extra-axial fluid
collection, or acute infarct. Parenchymal volume is normal. The
ventricles are normal in size. There is no mass lesion. There is no
midline shift.

Vascular: No hyperdense vessel or unexpected calcification.

Skull: Normal. Negative for fracture or focal lesion.

Sinuses and orbits: The paranasal sinuses are clear. The globes and
orbits are unremarkable.

Review of the MIP images confirms the above findings

CTA NECK FINDINGS

Aortic arch: Normal.

Right carotid system: The right common, internal, and external
carotid arteries are patent, without hemodynamically significant
stenosis, occlusion, dissection, or aneurysm. There is a tonsillar
loop of the right internal carotid artery.

Left carotid system: The left common, internal, and external carotid
arteries are patent, without hemodynamically significant stenosis,
occlusion, dissection, or aneurysm.

Vertebral arteries: The right vertebral artery is dominant, a normal
variant. The vertebral arteries are patent, without hemodynamically
significant stenosis, occlusion, dissection, or aneurysm.

Skeleton: The bones are normal.

Other neck: The soft tissues are normal.

Upper chest: The lung apices are clear.

Review of the MIP images confirms the above findings

CTA HEAD FINDINGS

Anterior circulation: The intracranial ICAs are patent.

The bilateral MCAs are patent.

The bilateral ACAs are patent.

There is no aneurysm.

Posterior circulation: The bilateral V4 segments are patent. The
basilar artery is patent.

The bilateral PCAs are patent. The left posterior communicating
artery is not definitely identified

Venous sinuses: Patent.

Anatomic variants: As above.

Review of the MIP images confirms the above findings
IMPRESSION: 1. No acute intracranial pathology.
2. Normal vasculature of the head and neck; no significant stenosis,
occlusion, dissection, or aneurysm.

## 2022-08-25 DIAGNOSIS — Z3A39 39 weeks gestation of pregnancy: Secondary | ICD-10-CM | POA: Diagnosis not present

## 2022-08-25 DIAGNOSIS — O334XX0 Maternal care for disproportion of mixed maternal and fetal origin, not applicable or unspecified: Secondary | ICD-10-CM | POA: Diagnosis not present

## 2022-09-07 ENCOUNTER — Ambulatory Visit (INDEPENDENT_AMBULATORY_CARE_PROVIDER_SITE_OTHER): Payer: Managed Care, Other (non HMO) | Admitting: Nurse Practitioner

## 2022-09-07 ENCOUNTER — Encounter: Payer: Self-pay | Admitting: Nurse Practitioner

## 2022-09-07 VITALS — BP 120/81 | HR 78 | Temp 99.5°F | Resp 16 | Ht 64.5 in | Wt 127.0 lb

## 2022-09-07 DIAGNOSIS — G5712 Meralgia paresthetica, left lower limb: Secondary | ICD-10-CM

## 2022-09-07 MED ORDER — GABAPENTIN 300 MG PO CAPS: 300.0000 mg | ORAL_CAPSULE | Freq: Every day | ORAL | 0 refills | Status: DC

## 2022-09-07 NOTE — Patient Instructions (Signed)
It was great to see you!  Start gabapentin 1 capsule at bedtime.  Start the stretches once a day.   Let's follow-up if your symptoms aren't getting better  Take care,  Rodman Pickle, NP

## 2022-09-07 NOTE — Assessment & Plan Note (Signed)
Start gabapentin 300mg  at bedtime. Stretches given to do daily. Limit wearing tight clothing and belts. Symptoms started with pregnancy, may get better since she has had her baby 2 weeks ago. Follow-up in 4 weeks or sooner with concerns.

## 2022-09-07 NOTE — Progress Notes (Signed)
Acute Office Visit  Subjective:     Patient ID: Karina Horne, female    DOB: [birth year], 22 y.o.   MRN: [id number]  Chief Complaint  Patient presents with   Leg Pain    Intermittent Left leg pain.Outer thigh,  Stated around October. Sharp stabbing pain, Pain 10/10     HPI Patient is in today for intermittent left leg pain. It started during her pregnancy. She stated that after she gave birth, the pain has worsened. She rates the pain a 10/10. She states the pain will happen when she gets up and takes a step. It will also happen when moving a certain way in the bed. She states the area of her outer thigh also feels different than the rest of her leg. The pain will last for seconds to a minute and is sharp and stabbing in quality. It tends to happen 3-5 times a day, although yesterday it didn't happen at all. It started happening originally October-December. She has tried ibuprofen which has not helped. The pain does not radiate. She denies injury that she is aware of present or past.   ROS See pertinent positives and negatives per HPI.     Objective:    BP 120/81 (BP Location: Left Arm, Patient Position: Sitting, Cuff Size: Normal)   Pulse 78   Temp 99.5 F (37.5 C) (Oral)   Resp 16   Ht 5' 4.5" (1.638 m)   Wt 127 lb (57.6 kg)   SpO2 98%   BMI 21.46 kg/m    Physical Exam Vitals and nursing note reviewed.  Constitutional:      General: She is not in acute distress.    Appearance: Normal appearance.  HENT:     Head: Normocephalic.  Eyes:     Conjunctiva/sclera: Conjunctivae normal.  Pulmonary:     Effort: Pulmonary effort is normal.  Musculoskeletal:        General: Tenderness (left lateral thigh) present. No swelling.     Cervical back: Normal range of motion.     Comments: Slight numbness/different sensation when touching lateral thigh versus other area of leg. Monofilament sensation intact   Skin:    General: Skin is warm.  Neurological:     General: No focal  deficit present.     Mental Status: She is alert and oriented to person, place, and time.  Psychiatric:        Mood and Affect: Mood normal.        Behavior: Behavior normal.        Thought Content: Thought content normal.        Judgment: Judgment normal.       Assessment & Plan:   Problem List Items Addressed This Visit       Nervous and Auditory   Meralgia paresthetica of left side - Primary    Start gabapentin 300mg  at bedtime. Stretches given to do daily. Limit wearing tight clothing and belts. Symptoms started with pregnancy, may get better since she has had her baby 2 weeks ago. Follow-up in 4 weeks or sooner with concerns.       Relevant Medications   gabapentin (NEURONTIN) 300 MG capsule    Meds ordered this encounter  Medications   gabapentin (NEURONTIN) 300 MG capsule    Sig: Take 1 capsule (300 mg total) by mouth at bedtime.    Dispense:  30 capsule    Refill:  0    Return if symptoms worsen or fail to improve.  Leotis Shames  Avelina Laine, NP

## 2022-10-04 ENCOUNTER — Other Ambulatory Visit: Payer: Self-pay | Admitting: Nurse Practitioner

## 2022-10-11 DIAGNOSIS — Z1331 Encounter for screening for depression: Secondary | ICD-10-CM | POA: Diagnosis not present

## 2022-10-11 DIAGNOSIS — R300 Dysuria: Secondary | ICD-10-CM | POA: Diagnosis not present

## 2022-12-13 ENCOUNTER — Encounter: Payer: Self-pay | Admitting: Nurse Practitioner

## 2022-12-13 ENCOUNTER — Ambulatory Visit: Payer: Managed Care, Other (non HMO) | Admitting: Nurse Practitioner

## 2022-12-13 ENCOUNTER — Other Ambulatory Visit (HOSPITAL_COMMUNITY)
Admission: RE | Admit: 2022-12-13 | Discharge: 2022-12-13 | Disposition: A | Discharge status: Home / Self Care | Payer: Managed Care, Other (non HMO) | Source: Ambulatory Visit | Attending: Nurse Practitioner | Admitting: Nurse Practitioner

## 2022-12-13 VITALS — BP 122/80 | HR 69 | Temp 97.8°F | Ht 64.5 in | Wt 121.2 lb

## 2022-12-13 DIAGNOSIS — N898 Other specified noninflammatory disorders of vagina: Secondary | ICD-10-CM | POA: Diagnosis present

## 2022-12-13 DIAGNOSIS — R350 Frequency of micturition: Secondary | ICD-10-CM

## 2022-12-13 DIAGNOSIS — N939 Abnormal uterine and vaginal bleeding, unspecified: Secondary | ICD-10-CM | POA: Diagnosis not present

## 2022-12-13 DIAGNOSIS — Z23 Encounter for immunization: Secondary | ICD-10-CM

## 2022-12-13 LAB — POCT URINALYSIS DIPSTICK
Bilirubin, UA: NEGATIVE
Blood, UA: NEGATIVE
Glucose, UA: NEGATIVE
Ketones, UA: NEGATIVE
Leukocytes, UA: NEGATIVE
Nitrite, UA: NEGATIVE
Protein, UA: NEGATIVE
Spec Grav, UA: 1.015 (ref 1.010–1.025)
Urobilinogen, UA: 0.2 U/dL
pH, UA: 6 (ref 5.0–8.0)

## 2022-12-13 NOTE — Patient Instructions (Signed)
It was great to see you!  We are checking your labs today and will let you know the results via mychart/phone.   Let's follow-up if symptoms worsen or any concerns.   Take care,  Rodman Pickle, NP

## 2022-12-13 NOTE — Progress Notes (Signed)
Acute Office Visit  Subjective:     Patient ID: Karina Horne, female    DOB: [birth year], 22 y.o.   MRN: [id number]  Chief Complaint  Patient presents with   Vaginal Bleeding    Gave birth 3 months ago, abdominal pain    HPI Patient is in today for vaginal bleeding and abdominal pain.   She gave birth 3 months ago. She states that she has been having vaginal odor and lower abdominal pain. The abdominal pain has been intermittent for the last 2 weeks. She states that it feels like menstrual cramps. She is currently using the depo injections for birth control and started this in June after the hospitalization. She states the vaginal bleeding started as heavy, then became light. Now she is having some intermittent bleeding, some days not at all, some days spotting or enough for tampon/pad. She denies dysuria, but is experiencing urinary frequency. She denies fevers.   ROS See pertinent positives and negatives per HPI.    Objective:    BP 122/80 (BP Location: Left Arm)   Pulse 69   Temp 97.8 F (36.6 C)   Ht 5' 4.5" (1.638 m)   Wt 121 lb 3.2 oz (55 kg)   SpO2 99%   BMI 20.48 kg/m     Physical Exam Vitals and nursing note reviewed.  Constitutional:      General: She is not in acute distress.    Appearance: Normal appearance.  HENT:     Head: Normocephalic.  Eyes:     Conjunctiva/sclera: Conjunctivae normal.  Cardiovascular:     Rate and Rhythm: Normal rate and regular rhythm.     Pulses: Normal pulses.     Heart sounds: Normal heart sounds.  Pulmonary:     Effort: Pulmonary effort is normal.     Breath sounds: Normal breath sounds.  Abdominal:     Palpations: Abdomen is soft.     Tenderness: There is abdominal tenderness (Lower pelvis).  Musculoskeletal:     Cervical back: Normal range of motion.  Skin:    General: Skin is warm.  Neurological:     General: No focal deficit present.     Mental Status: She is alert and oriented to person, place, and time.   Psychiatric:        Mood and Affect: Mood normal.        Behavior: Behavior normal.        Thought Content: Thought content normal.        Judgment: Judgment normal.      Assessment & Plan:   Problem List Items Addressed This Visit   None Visit Diagnoses     Abnormal uterine bleeding    -  Primary   Most likely related to recent delivery and starting depo provera injection. Should stop after a few months. F/U if not improving   Vaginal odor       Check swab for gonorrhea, chlamydia, trich, BV, and yeast. Treat based on results.   Relevant Orders   Cervicovaginal ancillary only   Urinary frequency       U/A negative, however she would like it sent out for culture. Order placed.   Relevant Orders   POCT urinalysis dipstick (Completed)   Urine Culture   Immunization due       HPV vaccine #1 given today. Will need nurse visit in 2 months and 6 months for second and third HPV vaccines.   Relevant Orders   HPV 9-valent vaccine,Recombinat (Completed)  No orders of the defined types were placed in this encounter.   Return if symptoms worsen or fail to improve.  Gerre Scull, NP

## 2022-12-14 LAB — CERVICOVAGINAL ANCILLARY ONLY
Bacterial Vaginitis (gardnerella): POSITIVE — AB
Candida Glabrata: NEGATIVE
Candida Vaginitis: NEGATIVE
Chlamydia: NEGATIVE
Comment: NEGATIVE
Comment: NEGATIVE
Comment: NEGATIVE
Comment: NEGATIVE
Comment: NEGATIVE
Comment: NORMAL
Neisseria Gonorrhea: NEGATIVE
Trichomonas: NEGATIVE

## 2022-12-14 MED ORDER — METRONIDAZOLE 500 MG PO TABS: 500.0000 mg | ORAL_TABLET | Freq: Two times a day (BID) | ORAL | 0 refills | Status: AC

## 2022-12-14 NOTE — Addendum Note (Signed)
Addended by: Rodman Pickle A on: 12/14/2022 11:21 AM   Modules accepted: Orders

## 2022-12-16 LAB — URINE CULTURE
MICRO NUMBER:: 15560852
SPECIMEN QUALITY:: ADEQUATE

## 2022-12-16 MED ORDER — SULFAMETHOXAZOLE-TRIMETHOPRIM 800-160 MG PO TABS: 1.0000 | ORAL_TABLET | Freq: Two times a day (BID) | ORAL | 0 refills | Status: DC

## 2022-12-16 NOTE — Addendum Note (Signed)
Addended by: Rodman Pickle A on: 12/16/2022 05:17 PM   Modules accepted: Orders

## 2022-12-20 ENCOUNTER — Encounter: Payer: Self-pay | Admitting: Nurse Practitioner

## 2022-12-20 ENCOUNTER — Ambulatory Visit (INDEPENDENT_AMBULATORY_CARE_PROVIDER_SITE_OTHER): Payer: Managed Care, Other (non HMO) | Admitting: Nurse Practitioner

## 2022-12-20 VITALS — BP 118/78 | HR 102 | Temp 97.7°F | Ht 64.5 in | Wt 118.6 lb

## 2022-12-20 DIAGNOSIS — G5712 Meralgia paresthetica, left lower limb: Secondary | ICD-10-CM

## 2022-12-20 DIAGNOSIS — Z0000 Encounter for general adult medical examination without abnormal findings: Secondary | ICD-10-CM | POA: Insufficient documentation

## 2022-12-20 DIAGNOSIS — D508 Other iron deficiency anemias: Secondary | ICD-10-CM

## 2022-12-20 DIAGNOSIS — D500 Iron deficiency anemia secondary to blood loss (chronic): Secondary | ICD-10-CM

## 2022-12-20 LAB — CBC WITH DIFFERENTIAL/PLATELET
Basophils Absolute: 0 10*3/uL (ref 0.0–0.1)
Basophils Relative: 0.4 % (ref 0.0–3.0)
Eosinophils Absolute: 0 10*3/uL (ref 0.0–0.7)
Eosinophils Relative: 0.1 % (ref 0.0–5.0)
HCT: 31.6 % — ABNORMAL LOW (ref 36.0–46.0)
Hemoglobin: 9.8 g/dL — ABNORMAL LOW (ref 12.0–15.0)
Lymphocytes Relative: 19.1 % (ref 12.0–46.0)
Lymphs Abs: 0.8 10*3/uL (ref 0.7–4.0)
MCHC: 31 g/dL (ref 30.0–36.0)
MCV: 72.6 fL — ABNORMAL LOW (ref 78.0–100.0)
Monocytes Absolute: 0.9 10*3/uL (ref 0.1–1.0)
Monocytes Relative: 22.4 % — ABNORMAL HIGH (ref 3.0–12.0)
Neutro Abs: 2.4 10*3/uL (ref 1.4–7.7)
Neutrophils Relative %: 58 % (ref 43.0–77.0)
Platelets: 117 10*3/uL — ABNORMAL LOW (ref 150.0–400.0)
RBC: 4.35 Mil/uL (ref 3.87–5.11)
RDW: 14.7 % (ref 11.5–15.5)
WBC: 4.2 10*3/uL (ref 4.0–10.5)

## 2022-12-20 LAB — COMPREHENSIVE METABOLIC PANEL
ALT: 21 U/L (ref 0–35)
AST: 19 U/L (ref 0–37)
Albumin: 4.8 g/dL (ref 3.5–5.2)
Alkaline Phosphatase: 58 U/L (ref 39–117)
BUN: 5 mg/dL — ABNORMAL LOW (ref 6–23)
CO2: 22 meq/L (ref 19–32)
Calcium: 9.6 mg/dL (ref 8.4–10.5)
Chloride: 108 meq/L (ref 96–112)
Creatinine, Ser: 0.67 mg/dL (ref 0.40–1.20)
GFR: 125.28 mL/min (ref >60.00)
Glucose, Bld: 88 mg/dL (ref 70–99)
Potassium: 4.2 meq/L (ref 3.5–5.1)
Sodium: 138 meq/L (ref 135–145)
Total Bilirubin: 0.3 mg/dL (ref 0.2–1.2)
Total Protein: 7.3 g/dL (ref 6.0–8.3)

## 2022-12-20 LAB — VITAMIN B12: Vitamin B-12: 314 pg/mL (ref 211–911)

## 2022-12-20 MED ORDER — GABAPENTIN 300 MG PO CAPS: 300.0000 mg | ORAL_CAPSULE | Freq: Every day | ORAL | 1 refills | Status: DC

## 2022-12-20 NOTE — Assessment & Plan Note (Signed)
Chronic, ongoing. Now pain is just intermittent. Continue stretching. Will refill gabapentin 300mg  at bedtime as needed.

## 2022-12-20 NOTE — Assessment & Plan Note (Signed)
Last hemoglobin was 9.3 after pregnancy. She is taking an iron supplement daily. Will check CBC and iron panel today and adjust regimen based on results.

## 2022-12-20 NOTE — Assessment & Plan Note (Signed)
Health maintenance reviewed and updated. Discussed nutrition, exercise. Check CMP, CBC today. Follow-up 1 year.

## 2022-12-20 NOTE — Progress Notes (Signed)
BP 118/78 (BP Location: Left Arm)   Pulse (!) 102   Temp 97.7 F (36.5 C)   Ht 5' 4.5" (1.638 m)   Wt 118 lb 9.6 oz (53.8 kg)   SpO2 99%   BMI 20.04 kg/m    Subjective:    Patient ID: Karina Horne, female    DOB: 2000/12/07, 22 y.o.   MRN: [id number]  CC: Chief Complaint  Patient presents with   Annual Exam    With fasting lab work, concerns with left leg pain    HPI: Karina Horne is a 22 y.o. female presenting on 12/20/2022 for comprehensive medical examination. Current medical complaints include: left leg pain  She has been having intermittent pain in her left outer thigh. She states that the gabapentin did help with the pain. Now it will just come randomly at times. She does stretch regularly.   She currently lives with: significant other, son Menopausal Symptoms: no  Depression and Anxiety Screen done today and results listed below:     12/20/2022   10:41 AM 12/13/2022    8:39 AM 12/16/2021   10:57 AM 12/16/2021   10:31 AM 11/26/2020   10:12 AM  Depression screen PHQ 2/9  Decreased Interest 0 0 0 0 0  Down, Depressed, Hopeless 0 0 0 0 0  PHQ - 2 Score 0 0 0 0 0  Altered sleeping 0 0     Tired, decreased energy 0 0     Change in appetite 0 0     Feeling bad or failure about yourself  0 0     Trouble concentrating 0 0     Moving slowly or fidgety/restless 0 0     Suicidal thoughts 0 0     PHQ-9 Score 0 0     Difficult doing work/chores Not difficult at all Not difficult at all         12/20/2022   10:41 AM 12/13/2022    8:39 AM 12/16/2021   10:57 AM  GAD 7 : Generalized Anxiety Score  Nervous, Anxious, on Edge 0 0 0  Control/stop worrying 0 0 0  Worry too much - different things 0 0 0  Trouble relaxing 0 0 0  Restless 0 0 0  Easily annoyed or irritable 0 0 0  Afraid - awful might happen 0 0 0  Total GAD 7 Score 0 0 0  Anxiety Difficulty Not difficult at all Not difficult at all Not difficult at all    The patient does not have a history of  falls. I did not complete a risk assessment for falls. A plan of care for falls was not documented.   Past Medical History:  Past Medical History:  Diagnosis Date   Anemia     Surgical History:  History reviewed. No pertinent surgical history.  Medications:  Current Outpatient Medications on File Prior to Visit  Medication Sig   ferrous sulfate 325 (65 FE) MG EC tablet Take 1 tablet (325 mg total) by mouth daily with breakfast.   medroxyPROGESTERone (DEPO-PROVERA) 150 MG/ML injection Inject 150 mg into the muscle every 3 (three) months.   metroNIDAZOLE (FLAGYL) 500 MG tablet Take 1 tablet (500 mg total) by mouth 2 (two) times daily for 7 days. Do not drink alcohol with this medication   Prenatal Vit-Fe Fumarate-FA (MULTIVITAMIN-PRENATAL) 27-0.8 MG TABS tablet Take 1 tablet by mouth daily at 12 noon.   sulfamethoxazole-trimethoprim (BACTRIM DS) 800-160 MG tablet Take 1 tablet by mouth 2 (two)  times daily.   No current facility-administered medications on file prior to visit.    Allergies:  Allergies  Allergen Reactions   Amoxicillin Hives    Social History:  Social History   Socioeconomic History   Marital status: Single    Spouse name: Not on file   Number of children: 0   Years of education: Not on file   Highest education level: Not on file  Occupational History   Not on file  Tobacco Use   Smoking status: Never   Smokeless tobacco: Never  Vaping Use   Vaping status: Every Day   Substances: Nicotine  Substance and Sexual Activity   Alcohol use: Not Currently   Drug use: Yes    Frequency: 7.0 times per week    Types: Marijuana   Sexual activity: Yes    Birth control/protection: Condom  Other Topics Concern   Not on file  Social History Narrative   Not on file   Social Determinants of Health   Financial Resource Strain: Not on file  Food Insecurity: No Food Insecurity (08/24/2022)   Received from [facility], [facility]   Hunger Vital Sign     Worried About Running Out of Food in the Last Year: Never true    Ran Out of Food in the Last Year: Never true  Transportation Needs: No Transportation Needs (08/24/2022)   Received from [facility], [facility]   PRAPARE - Transportation    Lack of Transportation (Medical): No    Lack of Transportation (Non-Medical): No  Physical Activity: Not on file  Stress: No Stress Concern Present (08/24/2022)   Received from [facility], [facility] - Occupational Stress Questionnaire    Feeling of Stress : Not at all  Social Connections: Unknown (01/25/2022)   Received from [facility], [facility]   Social Network    Social Network: Not on file  Intimate Partner Violence: Not At Risk (08/24/2022)   Received from [facility], [facility]   Humiliation, Afraid, Rape, and Kick questionnaire    Fear of Current or Ex-Partner: No    Emotionally Abused: No    Physically Abused: No    Sexually Abused: No   Social History   Tobacco Use  Smoking Status Never  Smokeless Tobacco Never   Social History   Substance and Sexual Activity  Alcohol Use Not Currently    Family History:  Family History  Problem Relation Age of Onset   Healthy Mother    Healthy Father     Past medical history, surgical history, medications, allergies, family history and social history reviewed with patient today and changes made to appropriate areas of the chart.   Review of Systems  Constitutional: Negative.   HENT: Negative.    Eyes: Negative.   Respiratory: Negative.    Cardiovascular: Negative.   Gastrointestinal: Negative.   Genitourinary: Negative.   Musculoskeletal:  Positive for myalgias (left lateral thigh).  Skin: Negative.   Neurological: Negative.   Psychiatric/Behavioral: Negative.     All other ROS negative except what is listed above and in the HPI.      Objective:    BP 118/78 (BP Location: Left Arm)   Pulse (!) 102   Temp 97.7  F (36.5 C)   Ht 5' 4.5" (1.638 m)   Wt 118 lb 9.6 oz (53.8 kg)   SpO2 99%   BMI 20.04 kg/m   Wt Readings from Last 3 Encounters:  12/20/22  118 lb 9.6 oz (53.8 kg)  12/13/22 121 lb 3.2 oz (55 kg)  09/07/22 127 lb (57.6 kg)    Physical Exam Vitals and nursing note reviewed.  Constitutional:      General: She is not in acute distress.    Appearance: Normal appearance.  HENT:     Head: Normocephalic and atraumatic.     Right Ear: Tympanic membrane, ear canal and external ear normal.     Left Ear: Tympanic membrane, ear canal and external ear normal.  Eyes:     Conjunctiva/sclera: Conjunctivae normal.  Cardiovascular:     Rate and Rhythm: Normal rate and regular rhythm.     Pulses: Normal pulses.     Heart sounds: Normal heart sounds.  Pulmonary:     Effort: Pulmonary effort is normal.     Breath sounds: Normal breath sounds.  Abdominal:     Palpations: Abdomen is soft.     Tenderness: There is no abdominal tenderness.  Musculoskeletal:        General: Normal range of motion.     Cervical back: Normal range of motion and neck supple.     Right lower leg: No edema.     Left lower leg: No edema.  Lymphadenopathy:     Cervical: No cervical adenopathy.  Skin:    General: Skin is warm and dry.  Neurological:     General: No focal deficit present.     Mental Status: She is alert and oriented to person, place, and time.     Cranial Nerves: No cranial nerve deficit.     Coordination: Coordination normal.     Gait: Gait normal.  Psychiatric:        Mood and Affect: Mood normal.        Behavior: Behavior normal.        Thought Content: Thought content normal.        Judgment: Judgment normal.     Results for orders placed or performed in visit on 12/13/22  Urine Culture   Specimen: Urine  Result Value Ref Range   MICRO NUMBER: [id number]    SPECIMEN QUALITY: Adequate    Sample Source NOT GIVEN    STATUS: FINAL    ISOLATE 1: Klebsiella aerogenes (A)        Susceptibility   Klebsiella aerogenes - URINE CULTURE, REFLEX    AMOX/CLAVULANIC 16 Resistant     CEFAZOLIN* <=4 Resistant      * For uncomplicated UTI caused by E. coli, K. pneumoniae or P. mirabilis: Cefazolin is susceptible if MIC <32 mcg/mL and predicts susceptible to the oral agents cefaclor, cefdinir, cefpodoxime, cefprozil, cefuroxime, cephalexin and loracarbef.     CEFTAZIDIME <=1 Sensitive     CEFEPIME <=1 Sensitive     CEFTRIAXONE <=1 Sensitive     CIPROFLOXACIN <=0.25 Sensitive     LEVOFLOXACIN <=0.12 Sensitive     GENTAMICIN <=1 Sensitive     IMIPENEM 1 Sensitive     NITROFURANTOIN 64 Intermediate     PIP/TAZO 8 Sensitive     TOBRAMYCIN <=1 Sensitive     TRIMETH/SULFA* <=20 Sensitive      * For uncomplicated UTI caused by E. coli, K. pneumoniae or P. mirabilis: Cefazolin is susceptible if MIC <32 mcg/mL and predicts susceptible to the oral agents cefaclor, cefdinir, cefpodoxime, cefprozil, cefuroxime, cephalexin and loracarbef. Legend: S = Susceptible  I = Intermediate R = Resistant  NS = Not susceptible SDD = Susceptible Dose Dependent * = Not Tested  NR =  Not Reported **NN = See Therapy Comments   POCT urinalysis dipstick  Result Value Ref Range   Color, UA     Clarity, UA     Glucose, UA Negative Negative   Bilirubin, UA Negative    Ketones, UA Negative    Spec Grav, UA 1.015 1.010 - 1.025   Blood, UA Negative    pH, UA 6.0 5.0 - 8.0   Protein, UA Negative Negative   Urobilinogen, UA 0.2 0.2 or 1.0 E.U./dL   Nitrite, UA Negative    Leukocytes, UA Negative Negative   Appearance     Odor    Cervicovaginal ancillary only  Result Value Ref Range   Neisseria Gonorrhea Negative    Chlamydia Negative    Trichomonas Negative    Bacterial Vaginitis (gardnerella) Positive (A)    Candida Vaginitis Negative    Candida Glabrata Negative    Comment      Normal Reference Range Bacterial Vaginosis - Negative   Comment Normal Reference Range Candida Species  - Negative    Comment Normal Reference Range Candida Galbrata - Negative    Comment Normal Reference Range Trichomonas - Negative    Comment Normal Reference Ranger Chlamydia - Negative    Comment      Normal Reference Range Neisseria Gonorrhea - Negative      Assessment & Plan:   Problem List Items Addressed This Visit       Nervous and Auditory   Meralgia paresthetica of left side    Chronic, ongoing. Now pain is just intermittent. Continue stretching. Will refill gabapentin 300mg  at bedtime as needed.       Relevant Medications   gabapentin (NEURONTIN) 300 MG capsule   Other Relevant Orders   Vitamin B12     Other   Iron deficiency anemia    Last hemoglobin was 9.3 after pregnancy. She is taking an iron supplement daily. Will check CBC and iron panel today and adjust regimen based on results.       Relevant Orders   Comprehensive metabolic panel   Iron, TIBC and Ferritin Panel   Routine general medical examination at a health care facility - Primary    Health maintenance reviewed and updated. Discussed nutrition, exercise. Check CMP, CBC today. Follow-up 1 year.        Relevant Orders   CBC with Differential/Platelet   Comprehensive metabolic panel     Follow up plan: Return in about 1 year (around 12/20/2023) for CPE.   LABORATORY TESTING:  - Pap smear:  scheduled with GYN  IMMUNIZATIONS:   - Tdap: Tetanus vaccination status reviewed: last tetanus booster within 10 years. - Influenza:  Declined - Pneumovax: Not applicable - Prevnar: Not applicable - HPV: Not applicable - Shingrix vaccine: Not applicable  SCREENING: -Mammogram: Not applicable  - Colonoscopy: Not applicable  - Bone Density: Not applicable   PATIENT COUNSELING:   Advised to take 1 mg of folate supplement per day if capable of pregnancy.   Sexuality: Discussed sexually transmitted diseases, partner selection, use of condoms, avoidance of unintended pregnancy  and contraceptive  alternatives.   Advised to avoid cigarette smoking.  I discussed with the patient that most people either abstain from alcohol or drink within safe limits (<=14/week and <=4 drinks/occasion for males, <=7/weeks and <= 3 drinks/occasion for females) and that the risk for alcohol disorders and other health effects rises proportionally with the number of drinks per week and how often a drinker exceeds daily limits.  Discussed  cessation/primary prevention of drug use and availability of treatment for abuse.   Diet: Encouraged to adjust caloric intake to maintain  or achieve ideal body weight, to reduce intake of dietary saturated fat and total fat, to limit sodium intake by avoiding high sodium foods and not adding table salt, and to maintain adequate dietary potassium and calcium preferably from fresh fruits, vegetables, and low-fat dairy products.    stressed the importance of regular exercise  Injury prevention: Discussed safety belts, safety helmets, smoke detector, smoking near bedding or upholstery.   Dental health: Discussed importance of regular tooth brushing, flossing, and dental visits.    NEXT PREVENTATIVE PHYSICAL DUE IN 1 YEAR. Return in about 1 year (around 12/20/2023) for CPE.  Denishia Citro A [place]

## 2022-12-20 NOTE — Patient Instructions (Signed)
It was great to see you!  We are checking your labs today and will let you know the results via mychart/phone.   I have refilled your gabapentin to take at bedtime as needed.   Let's follow-up in 1 year, sooner if you have concerns.  If a referral was placed today, you will be contacted for an appointment. Please note that routine referrals can sometimes take up to 3-4 weeks to process. Please call our office if you haven't heard anything after this time frame.  Take care,  Rodman Pickle, NP

## 2022-12-21 LAB — IRON,TIBC AND FERRITIN PANEL
%SAT: 9 % — ABNORMAL LOW (ref 16–45)
Ferritin: 97 ng/mL (ref 16–154)
Iron: 25 ug/dL — ABNORMAL LOW (ref 40–190)
TIBC: 271 ug/dL (ref 250–450)

## 2023-01-13 ENCOUNTER — Encounter: Payer: Self-pay | Admitting: Nurse Practitioner

## 2023-01-13 ENCOUNTER — Other Ambulatory Visit (HOSPITAL_COMMUNITY)
Admission: RE | Admit: 2023-01-13 | Discharge: 2023-01-13 | Disposition: A | Discharge status: Home / Self Care | Payer: Managed Care, Other (non HMO) | Source: Ambulatory Visit | Attending: Nurse Practitioner | Admitting: Nurse Practitioner

## 2023-01-13 ENCOUNTER — Ambulatory Visit (INDEPENDENT_AMBULATORY_CARE_PROVIDER_SITE_OTHER): Payer: Managed Care, Other (non HMO) | Admitting: Nurse Practitioner

## 2023-01-13 VITALS — BP 110/62 | HR 61 | Temp 97.9°F | Ht 64.5 in | Wt 119.0 lb

## 2023-01-13 DIAGNOSIS — R8290 Unspecified abnormal findings in urine: Secondary | ICD-10-CM

## 2023-01-13 DIAGNOSIS — N898 Other specified noninflammatory disorders of vagina: Secondary | ICD-10-CM

## 2023-01-13 LAB — POCT URINALYSIS DIPSTICK
Bilirubin, UA: NEGATIVE
Blood, UA: POSITIVE
Glucose, UA: NEGATIVE
Ketones, UA: NEGATIVE
Leukocytes, UA: NEGATIVE
Nitrite, UA: NEGATIVE
Protein, UA: NEGATIVE
Spec Grav, UA: 1.015 (ref 1.010–1.025)
Urobilinogen, UA: 0.2 U/dL
pH, UA: 6 (ref 5.0–8.0)

## 2023-01-13 NOTE — Patient Instructions (Signed)
It was great to see you!  I will let you know the results of your tests.   Let's follow-up if symptoms worsen or don't improve  Take care,  Rodman Pickle, NP

## 2023-01-13 NOTE — Progress Notes (Signed)
Acute Office Visit  Subjective:     Patient ID: Karina Horne, female    DOB: 19-Sep-2000, 22 y.o.   MRN: [id number]  Chief Complaint  Patient presents with   Urine Problem     Urine odor and pink like discharge with no pain or discomfort    HPI Patient is in today for urine odor and pink vaginal discharge.  Discussed the use of AI scribe software for clinical note transcription with the patient, who gave verbal consent to proceed.  History of Present Illness   The patient, with a history of bacterial vaginosis (BV) and urinary tract infection (UTI), presents with concerns of strong urine odor and abnormal vaginal discharge. The odor is described as 'terrible' and 'very, very strong.' She denies dysuria, abdominal pain, and pelvic pain. The patient also reports a change in vaginal discharge, described as 'pink light discharge,' which she attributes to her menstrual cycle.      ROS See pertinent positives and negatives per HPI.     Objective:    BP 110/62 (BP Location: Left Arm)   Pulse 61   Temp 97.9 F (36.6 C)   Ht 5' 4.5" (1.638 m)   Wt 119 lb (54 kg)   SpO2 98%   BMI 20.11 kg/m    Physical Exam Vitals and nursing note reviewed.  Constitutional:      General: She is not in acute distress.    Appearance: Normal appearance.  HENT:     Head: Normocephalic.  Eyes:     Conjunctiva/sclera: Conjunctivae normal.  Pulmonary:     Effort: Pulmonary effort is normal.  Abdominal:     Palpations: Abdomen is soft.     Tenderness: There is no abdominal tenderness. There is no right CVA tenderness or left CVA tenderness.  Musculoskeletal:     Cervical back: Normal range of motion.  Skin:    General: Skin is warm.  Neurological:     General: No focal deficit present.     Mental Status: She is alert and oriented to person, place, and time.  Psychiatric:        Mood and Affect: Mood normal.        Behavior: Behavior normal.        Thought Content: Thought content  normal.        Judgment: Judgment normal.     Results for orders placed or performed in visit on 01/13/23  POCT urinalysis dipstick  Result Value Ref Range   Color, UA     Clarity, UA     Glucose, UA Negative Negative   Bilirubin, UA Negative    Ketones, UA Negative    Spec Grav, UA 1.015 1.010 - 1.025   Blood, UA Positive    pH, UA 6.0 5.0 - 8.0   Protein, UA Negative Negative   Urobilinogen, UA 0.2 0.2 or 1.0 E.U./dL   Nitrite, UA Negative    Leukocytes, UA Negative Negative   Appearance     Odor          Assessment & Plan:   Problem List Items Addressed This Visit   None Visit Diagnoses     Abnormal urine odor    -  Primary   U/A negative. Will add on urine culture. She did finish bactrim prescription 3 weeks ago. Encourage fluids.   Relevant Orders   POCT urinalysis dipstick (Completed)   Urine Culture   Vaginal discharge       Check vaginal swab for BV,  yeast, and trich. Treat based on results.   Relevant Orders   Cervicovaginal ancillary only       No orders of the defined types were placed in this encounter.   Return if symptoms worsen or fail to improve.  Gerre Scull, NP

## 2023-01-14 LAB — CERVICOVAGINAL ANCILLARY ONLY
Bacterial Vaginitis (gardnerella): POSITIVE — AB
Candida Glabrata: NEGATIVE
Candida Vaginitis: NEGATIVE
Comment: NEGATIVE
Comment: NEGATIVE
Comment: NEGATIVE
Comment: NEGATIVE
Trichomonas: NEGATIVE

## 2023-01-14 MED ORDER — METRONIDAZOLE 500 MG PO TABS: 500.0000 mg | ORAL_TABLET | Freq: Two times a day (BID) | ORAL | 0 refills | Status: AC

## 2023-01-14 NOTE — Addendum Note (Signed)
Addended by: Rodman Pickle A on: 01/14/2023 01:37 PM   Modules accepted: Orders

## 2023-01-15 LAB — URINE CULTURE
MICRO NUMBER:: 15700243
Result:: NO GROWTH
SPECIMEN QUALITY:: ADEQUATE

## 2023-02-15 ENCOUNTER — Ambulatory Visit (INDEPENDENT_AMBULATORY_CARE_PROVIDER_SITE_OTHER): Payer: Managed Care, Other (non HMO)

## 2023-02-15 DIAGNOSIS — Z23 Encounter for immunization: Secondary | ICD-10-CM

## 2023-02-15 NOTE — Progress Notes (Signed)
Patient presents for second Gardasil 9 injection. Injection placed in left deltoid region by Malena Peer, CMA. Patient tolerated procedure well with no concerns.  Next dose recommended by [facility] is 05/13/2023 and patient aware and will schedule a nurse visit.

## 2023-03-04 ENCOUNTER — Encounter: Payer: Self-pay | Admitting: Nurse Practitioner

## 2023-03-15 ENCOUNTER — Encounter: Payer: Self-pay | Admitting: Nurse Practitioner

## 2023-03-15 ENCOUNTER — Ambulatory Visit (INDEPENDENT_AMBULATORY_CARE_PROVIDER_SITE_OTHER): Payer: Managed Care, Other (non HMO) | Admitting: Nurse Practitioner

## 2023-03-15 ENCOUNTER — Other Ambulatory Visit (HOSPITAL_COMMUNITY)
Admission: RE | Admit: 2023-03-15 | Discharge: 2023-03-15 | Disposition: A | Discharge status: Home / Self Care | Payer: Managed Care, Other (non HMO) | Source: Ambulatory Visit | Attending: Nurse Practitioner | Admitting: Nurse Practitioner

## 2023-03-15 VITALS — BP 118/80 | HR 87 | Temp 98.9°F | Resp 18 | Wt 112.4 lb

## 2023-03-15 DIAGNOSIS — N898 Other specified noninflammatory disorders of vagina: Secondary | ICD-10-CM | POA: Diagnosis not present

## 2023-03-15 DIAGNOSIS — Z1159 Encounter for screening for other viral diseases: Secondary | ICD-10-CM | POA: Insufficient documentation

## 2023-03-15 DIAGNOSIS — R350 Frequency of micturition: Secondary | ICD-10-CM

## 2023-03-15 LAB — POC URINALSYSI DIPSTICK (AUTOMATED)
Bilirubin, UA: NEGATIVE
Blood, UA: NEGATIVE
Glucose, UA: NEGATIVE
Ketones, UA: NEGATIVE
Leukocytes, UA: NEGATIVE
Nitrite, UA: NEGATIVE
Protein, UA: NEGATIVE
Spec Grav, UA: 1.015 (ref 1.010–1.025)
Urobilinogen, UA: 0.2 U/dL
pH, UA: 7.5 (ref 5.0–8.0)

## 2023-03-15 MED ORDER — METRONIDAZOLE 0.75 % VA GEL
1.0000 | Freq: Every day | VAGINAL | 0 refills | Status: DC
Start: 2023-03-15 — End: 2023-12-21

## 2023-03-15 NOTE — Patient Instructions (Addendum)
 To minimize frequency of BV: start Cuturelle women's health probiotic 1cap daily. In the future, try Boric acid vaginal capsule insert 1 at bedtime x 3-5nights.  Urine is sent for culture. Start metronidazole gel

## 2023-03-15 NOTE — Progress Notes (Signed)
 Acute Office Visit  Subjective:    Patient ID: Karina Horne, female    DOB: January 07, 2001, 23 y.o.   MRN: [id number]  Chief Complaint  Patient presents with   Acute Visit    PT C/O of UTI for 1 week with frequent urination, back pain and discharge.    HPI Patient is in today for recurrent vaginal discharge and urinary frequency. She was treated for BV in October and November with metronidazole  tabs. She was treated for UTI in October with bactrim . She thinks, her trigger is sexual intercourse. She is sexually active, no condom used. Use of depo provera  for contraception. No rash, no pelvic pain, no dysuria, no fever. She agreed to STI screen today  Outpatient Medications Prior to Visit  Medication Sig   ferrous sulfate  325 (65 FE) MG EC tablet Take 1 tablet (325 mg total) by mouth daily with breakfast.   gabapentin  (NEURONTIN ) 300 MG capsule Take 1 capsule (300 mg total) by mouth at bedtime.   medroxyPROGESTERone  (DEPO-PROVERA ) 150 MG/ML injection Inject 150 mg into the muscle every 3 (three) months.   Prenatal Vit-Fe Fumarate-FA (MULTIVITAMIN-PRENATAL) 27-0.8 MG TABS tablet Take 1 tablet by mouth daily at 12 noon.   sulfamethoxazole -trimethoprim  (BACTRIM  DS) 800-160 MG tablet Take 1 tablet by mouth 2 (two) times daily.   No facility-administered medications prior to visit.   Reviewed past medical and social history.  Review of Systems Per HPI     Objective:    Physical Exam Vitals and nursing note reviewed.  Constitutional:      General: She is not in acute distress. Genitourinary:    Comments: She opted to self swab Neurological:     Mental Status: She is alert.    BP 118/80 (BP Location: Left Arm, Patient Position: Sitting, Cuff Size: Normal)   Pulse 87   Temp 98.9 F (37.2 C) (Temporal)   Resp 18   Wt 112 lb 6.4 oz (51 kg)   LMP 01/21/2023 (Exact Date)   SpO2 100%   BMI 19.00 kg/m    Results for orders placed or performed in visit on 03/15/23  POCT  Urinalysis Dipstick (Automated)  Result Value Ref Range   Color, UA YELLOW    Clarity, UA clear    Glucose, UA Negative Negative   Bilirubin, UA negative    Ketones, UA negative    Spec Grav, UA 1.015 1.010 - 1.025   Blood, UA negative    pH, UA 7.5 5.0 - 8.0   Protein, UA Negative Negative   Urobilinogen, UA 0.2 0.2 or 1.0 E.U./dL   Nitrite, UA negative    Leukocytes, UA Negative Negative      Assessment & Plan:   Problem List Items Addressed This Visit   None Visit Diagnoses       Urinary frequency    -  Primary   Relevant Orders   POCT Urinalysis Dipstick (Automated) (Completed)   Urine Culture     Vaginal discharge       Relevant Medications   metroNIDAZOLE  (METROGEL ) 0.75 % vaginal gel   Other Relevant Orders   Cervicovaginal ancillary only     Encounter for screening for viral disease       Relevant Orders   Cervicovaginal ancillary only      Meds ordered this encounter  Medications   metroNIDAZOLE  (METROGEL ) 0.75 % vaginal gel    Sig: Place 1 Applicatorful vaginally at bedtime.    Dispense:  70 g    Refill:  0    Supervising Provider:   BERNETA FALLOW ALFRED [[id number]]   To minimize frequency of BV: start Cuturelle women's health probiotic 1cap daily. In the future, try Boric acid vaginal capsule insert 1 at bedtime x 3-5nights. Consider use of metrogel  after intercourse if probiotic and boric acid does not decrease frequency of BV. Urine is sent for culture.  Return if symptoms worsen or fail to improve.  Roselie Mood, NP

## 2023-03-16 LAB — CERVICOVAGINAL ANCILLARY ONLY
Bacterial Vaginitis (gardnerella): NEGATIVE
Candida Glabrata: NEGATIVE
Candida Vaginitis: NEGATIVE
Chlamydia: NEGATIVE
Comment: NEGATIVE
Comment: NEGATIVE
Comment: NEGATIVE
Comment: NEGATIVE
Comment: NEGATIVE
Comment: NORMAL
Neisseria Gonorrhea: NEGATIVE
Trichomonas: NEGATIVE

## 2023-03-16 LAB — URINE CULTURE
MICRO NUMBER:: 15927601
Result:: NO GROWTH
SPECIMEN QUALITY:: ADEQUATE

## 2023-03-27 ENCOUNTER — Encounter: Payer: Self-pay | Admitting: Nurse Practitioner

## 2023-04-27 DIAGNOSIS — R350 Frequency of micturition: Secondary | ICD-10-CM | POA: Diagnosis not present

## 2023-04-27 DIAGNOSIS — N760 Acute vaginitis: Secondary | ICD-10-CM | POA: Diagnosis not present

## 2023-04-27 DIAGNOSIS — R300 Dysuria: Secondary | ICD-10-CM | POA: Diagnosis not present

## 2023-05-18 ENCOUNTER — Ambulatory Visit (INDEPENDENT_AMBULATORY_CARE_PROVIDER_SITE_OTHER): Payer: Managed Care, Other (non HMO)

## 2023-05-18 DIAGNOSIS — Z23 Encounter for immunization: Secondary | ICD-10-CM | POA: Diagnosis not present

## 2023-05-18 NOTE — Progress Notes (Signed)
 Pt is here for 3rd HPV. pt received vaccine in left deltoid. Given by Rosine Abe . Pt tolerated vaccine well.

## 2023-08-08 DIAGNOSIS — R350 Frequency of micturition: Secondary | ICD-10-CM | POA: Diagnosis not present

## 2023-10-27 LAB — HM PAP SMEAR: HM Pap smear: NORMAL

## 2023-12-21 ENCOUNTER — Other Ambulatory Visit (HOSPITAL_COMMUNITY)
Admission: RE | Admit: 2023-12-21 | Discharge: 2023-12-21 | Disposition: A | Discharge status: Home / Self Care | Source: Ambulatory Visit | Attending: Nurse Practitioner | Admitting: Nurse Practitioner

## 2023-12-21 ENCOUNTER — Ambulatory Visit: Admitting: Nurse Practitioner

## 2023-12-21 ENCOUNTER — Encounter: Payer: Self-pay | Admitting: Nurse Practitioner

## 2023-12-21 VITALS — BP 108/72 | HR 71 | Temp 97.6°F | Ht 64.5 in | Wt 117.6 lb

## 2023-12-21 DIAGNOSIS — N898 Other specified noninflammatory disorders of vagina: Secondary | ICD-10-CM | POA: Diagnosis not present

## 2023-12-21 DIAGNOSIS — Z0000 Encounter for general adult medical examination without abnormal findings: Secondary | ICD-10-CM | POA: Diagnosis not present

## 2023-12-21 DIAGNOSIS — D500 Iron deficiency anemia secondary to blood loss (chronic): Secondary | ICD-10-CM

## 2023-12-21 LAB — COMPREHENSIVE METABOLIC PANEL WITH GFR
ALT: 20 U/L (ref 0–35)
AST: 18 U/L (ref 0–37)
Albumin: 5 g/dL (ref 3.5–5.2)
Alkaline Phosphatase: 59 U/L (ref 39–117)
BUN: 11 mg/dL (ref 6–23)
CO2: 26 meq/L (ref 19–32)
Calcium: 9.5 mg/dL (ref 8.4–10.5)
Chloride: 108 meq/L (ref 96–112)
Creatinine, Ser: 0.61 mg/dL (ref 0.40–1.20)
GFR: 127.24 mL/min (ref >60.00)
Glucose, Bld: 93 mg/dL (ref 70–99)
Potassium: 4 meq/L (ref 3.5–5.1)
Sodium: 140 meq/L (ref 135–145)
Total Bilirubin: 0.3 mg/dL (ref 0.2–1.2)
Total Protein: 7.7 g/dL (ref 6.0–8.3)

## 2023-12-21 LAB — POCT URINALYSIS DIPSTICK
Bilirubin, UA: NEGATIVE
Blood, UA: POSITIVE
Glucose, UA: NEGATIVE
Ketones, UA: NEGATIVE
Leukocytes, UA: NEGATIVE
Nitrite, UA: NEGATIVE
Protein, UA: NEGATIVE
Spec Grav, UA: 1.01 (ref 1.010–1.025)
Urobilinogen, UA: 0.2 U/dL
pH, UA: 6.5 (ref 5.0–8.0)

## 2023-12-21 LAB — CBC WITH DIFFERENTIAL/PLATELET
Basophils Absolute: 0 K/uL (ref 0.0–0.1)
Basophils Relative: 0.2 % (ref 0.0–3.0)
Eosinophils Absolute: 0 K/uL (ref 0.0–0.7)
Eosinophils Relative: 0.8 % (ref 0.0–5.0)
HCT: 32.9 % — ABNORMAL LOW (ref 36.0–46.0)
Hemoglobin: 10.4 g/dL — ABNORMAL LOW (ref 12.0–15.0)
Lymphocytes Relative: 40.4 % (ref 12.0–46.0)
Lymphs Abs: 2.3 K/uL (ref 0.7–4.0)
MCHC: 31.5 g/dL (ref 30.0–36.0)
MCV: 73.3 fl — ABNORMAL LOW (ref 78.0–100.0)
Monocytes Absolute: 0.4 K/uL (ref 0.1–1.0)
Monocytes Relative: 6.6 % (ref 3.0–12.0)
Neutro Abs: 3 K/uL (ref 1.4–7.7)
Neutrophils Relative %: 52 % (ref 43.0–77.0)
Platelets: 132 K/uL — ABNORMAL LOW (ref 150.0–400.0)
RBC: 4.49 Mil/uL (ref 3.87–5.11)
RDW: 13.6 % (ref 11.5–15.5)
WBC: 5.7 K/uL (ref 4.0–10.5)

## 2023-12-21 LAB — IRON: Iron: 84 ug/dL (ref 42–145)

## 2023-12-21 LAB — FERRITIN: Ferritin: 80.6 ng/mL (ref 10.0–291.0)

## 2023-12-21 NOTE — Assessment & Plan Note (Signed)
 She is taking an iron supplement daily. Will check CBC and iron panel today and adjust regimen based on results.

## 2023-12-21 NOTE — Patient Instructions (Signed)
 It was great to see you!  We are checking your labs today and will let you know the results via mychart/phone.   Drink plenty of fluids  Let's follow-up in 1 year, sooner if you have concerns.  If a referral was placed today, you will be contacted for an appointment. Please note that routine referrals can sometimes take up to 3-4 weeks to process. Please call our office if you haven't heard anything after this time frame.  Take care,  Tinnie Harada, NP

## 2023-12-21 NOTE — Progress Notes (Signed)
 BP 108/72 (BP Location: Left Arm, Patient Position: Sitting, Cuff Size: Small)   Pulse 71   Temp 97.6 F (36.4 C)   Ht 5' 4.5 (1.638 m)   Wt 117 lb 9.6 oz (53.3 kg)   SpO2 99%   BMI 19.87 kg/m    Subjective:    Patient ID: Karina Horne, female    DOB: Jul 29, 2000, 23 y.o.   MRN: [id number]  CC: Chief Complaint  Patient presents with   Annual Exam    With fasting labs, concerns with vaginal discomfort and odor    HPI: Karina Horne is a 23 y.o. female presenting on 12/21/2023 for comprehensive medical examination. Current medical complaints include:vaginal discomfort   She has been experiencing pelvic pressure and vaginal discomfort. She went to GYN, and was diagnosed with UTI. She takes antibiotics which makes it go away and then symptoms come back. She denies fevers, dysuria and flank pain.   Depression and Anxiety Screen done today and results listed below:     12/21/2023   10:34 AM 03/15/2023   10:27 AM 12/20/2022   10:41 AM 12/13/2022    8:39 AM 12/16/2021   10:57 AM  Depression screen PHQ 2/9  Decreased Interest 0 0 0 0 0  Down, Depressed, Hopeless 0 0 0 0 0  PHQ - 2 Score 0 0 0 0 0  Altered sleeping 0  0 0   Tired, decreased energy 0  0 0   Change in appetite 0  0 0   Feeling bad or failure about yourself  0  0 0   Trouble concentrating 0  0 0   Moving slowly or fidgety/restless 0  0 0   Suicidal thoughts 0  0 0   PHQ-9 Score 0  0 0   Difficult doing work/chores Not difficult at all  Not difficult at all Not difficult at all       12/21/2023   10:35 AM 12/20/2022   10:41 AM 12/13/2022    8:39 AM 12/16/2021   10:57 AM  GAD 7 : Generalized Anxiety Score  Nervous, Anxious, on Edge 1 0 0 0  Control/stop worrying 1 0 0 0  Worry too much - different things 1 0 0 0  Trouble relaxing 0 0 0 0  Restless 0 0 0 0  Easily annoyed or irritable 0 0 0 0  Afraid - awful might happen 0 0 0 0  Total GAD 7 Score 3 0 0 0  Anxiety Difficulty Not difficult at all  Not difficult at all Not difficult at all Not difficult at all    The patient does not have a history of falls. I did not complete a risk assessment for falls. A plan of care for falls was not documented.   Past Medical History:  Past Medical History:  Diagnosis Date   Anemia     Surgical History:  History reviewed. No pertinent surgical history.  Medications:  Current Outpatient Medications on File Prior to Visit  Medication Sig   ferrous sulfate  325 (65 FE) MG EC tablet Take 1 tablet (325 mg total) by mouth daily with breakfast.   medroxyPROGESTERone  (DEPO-PROVERA ) 150 MG/ML injection Inject 150 mg into the muscle every 3 (three) months.   No current facility-administered medications on file prior to visit.    Allergies:  Allergies  Allergen Reactions   Amoxicillin Hives    Social History:  Social History   Socioeconomic History   Marital status: Single    Spouse  name: Not on file   Number of children: 0   Years of education: Not on file   Highest education level: Not on file  Occupational History   Not on file  Tobacco Use   Smoking status: Never   Smokeless tobacco: Never  Vaping Use   Vaping status: Every Day   Substances: Nicotine  Substance and Sexual Activity   Alcohol use: Not Currently   Drug use: Yes    Frequency: 7.0 times per week    Types: Marijuana   Sexual activity: Yes    Birth control/protection: Condom  Other Topics Concern   Not on file  Social History Narrative   Not on file   Social Drivers of Health   Financial Resource Strain: Not on file  Food Insecurity: No Food Insecurity (08/24/2022)   Received from [facility]   Hunger Vital Sign    Within the past 12 months, you worried that your food would run out before you got the money to buy more.: Never true    Within the past 12 months, the food you bought just didn't last and you didn't have money to get more.: Never true  Transportation Needs: No Transportation Needs  (08/24/2022)   Received from [facility] - Transportation    Lack of Transportation (Medical): No    Lack of Transportation (Non-Medical): No  Physical Activity: Not on file  Stress: No Stress Concern Present (08/24/2022)   Received from [facility] - Occupational Stress Questionnaire    Feeling of Stress : Not at all  Social Connections: Unknown (01/25/2022)   Received from [facility]   Social Network    Social Network: Not on file  Intimate Partner Violence: Not At Risk (08/24/2022)   Received from [facility]   Humiliation, Afraid, Rape, and Kick questionnaire    Within the last year, have you been afraid of your partner or ex-partner?: No    Within the last year, have you been humiliated or emotionally abused in other ways by your partner or ex-partner?: No    Within the last year, have you been kicked, hit, slapped, or otherwise physically hurt by your partner or ex-partner?: No    Within the last year, have you been raped or forced to have any kind of sexual activity by your partner or ex-partner?: No   Social History   Tobacco Use  Smoking Status Never  Smokeless Tobacco Never   Social History   Substance and Sexual Activity  Alcohol Use Not Currently    Family History:  Family History  Problem Relation Age of Onset   Healthy Mother    Healthy Father     Past medical history, surgical history, medications, allergies, family history and social history reviewed with patient today and changes made to appropriate areas of the chart.   Review of Systems  Constitutional:  Positive for malaise/fatigue. Negative for fever.  HENT:  Positive for congestion. Negative for ear discharge, ear pain and sore throat.   Eyes: Negative.   Respiratory: Negative.    Cardiovascular: Negative.   Gastrointestinal: Negative.   Genitourinary:  Negative for dysuria.       Vaginal discharge - white  Musculoskeletal: Negative.    Skin: Negative.   Neurological: Negative.   Psychiatric/Behavioral: Negative.     All other ROS negative except what is listed above and in the HPI.      Objective:    BP 108/72 (  BP Location: Left Arm, Patient Position: Sitting, Cuff Size: Small)   Pulse 71   Temp 97.6 F (36.4 C)   Ht 5' 4.5 (1.638 m)   Wt 117 lb 9.6 oz (53.3 kg)   SpO2 99%   BMI 19.87 kg/m   Wt Readings from Last 3 Encounters:  12/21/23 117 lb 9.6 oz (53.3 kg)  03/15/23 112 lb 6.4 oz (51 kg)  01/13/23 119 lb (54 kg)    Physical Exam Vitals and nursing note reviewed.  Constitutional:      General: She is not in acute distress.    Appearance: Normal appearance.  HENT:     Head: Normocephalic and atraumatic.     Right Ear: Tympanic membrane, ear canal and external ear normal.     Left Ear: Tympanic membrane, ear canal and external ear normal.     Mouth/Throat:     Mouth: Mucous membranes are moist.     Pharynx: No posterior oropharyngeal erythema.  Eyes:     Conjunctiva/sclera: Conjunctivae normal.  Cardiovascular:     Rate and Rhythm: Normal rate and regular rhythm.     Pulses: Normal pulses.     Heart sounds: Normal heart sounds.  Pulmonary:     Effort: Pulmonary effort is normal.     Breath sounds: Normal breath sounds.  Abdominal:     Palpations: Abdomen is soft.     Tenderness: There is no abdominal tenderness.  Musculoskeletal:        General: Normal range of motion.     Cervical back: Normal range of motion and neck supple.     Right lower leg: No edema.     Left lower leg: No edema.  Lymphadenopathy:     Cervical: No cervical adenopathy.  Skin:    General: Skin is warm and dry.  Neurological:     General: No focal deficit present.     Mental Status: She is alert and oriented to person, place, and time.     Cranial Nerves: No cranial nerve deficit.     Coordination: Coordination normal.     Gait: Gait normal.  Psychiatric:        Mood and Affect: Mood normal.        Behavior:  Behavior normal.        Thought Content: Thought content normal.        Judgment: Judgment normal.     Results for orders placed or performed in visit on 12/21/23  HM PAP SMEAR   Collection Time: 10/27/23 12:00 AM  Result Value Ref Range   HM Pap smear normal   POCT urinalysis dipstick   Collection Time: 12/21/23 11:03 AM  Result Value Ref Range   Color, UA     Clarity, UA     Glucose, UA Negative Negative   Bilirubin, UA Negative    Ketones, UA Negative    Spec Grav, UA 1.010 1.010 - 1.025   Blood, UA Positive    pH, UA 6.5 5.0 - 8.0   Protein, UA Negative Negative   Urobilinogen, UA 0.2 0.2 or 1.0 E.U./dL   Nitrite, UA Negative    Leukocytes, UA Negative Negative   Appearance     Odor        Assessment & Plan:   Problem List Items Addressed This Visit       Other   Iron deficiency anemia   She is taking an iron supplement daily. Will check CBC and iron panel today and adjust regimen  based on results.       Relevant Orders   CBC with Differential/Platelet   Comprehensive metabolic panel with GFR   Ferritin   Iron   Routine general medical examination at a health care facility - Primary   Health maintenance reviewed and updated. Discussed nutrition, exercise. Check CMP, CBC today. Follow-up 1 year.        Other Visit Diagnoses       Vaginal discharge       U/A negative, but with recent UTI, add on urine culture. Check swab for BV, yeast, and trich.   Relevant Orders   Cervicovaginal ancillary only   POCT urinalysis dipstick (Completed)   Urine Culture        Follow up plan: Return in about 1 year (around 12/20/2024) for CPE.   LABORATORY TESTING:  - Pap smear: up to date  IMMUNIZATIONS:   - Tdap: Tetanus vaccination status reviewed: last tetanus booster within 10 years. - Influenza: Declined - Pneumovax: Not applicable - Prevnar: Not applicable - HPV: Up to date - Shingrix vaccine: Not applicable  SCREENING: -Mammogram: Not applicable  -  Colonoscopy: Not applicable  - Bone Density: Not applicable   PATIENT COUNSELING:   Advised to take 1 mg of folate supplement per day if capable of pregnancy.   Sexuality: Discussed sexually transmitted diseases, partner selection, use of condoms, avoidance of unintended pregnancy  and contraceptive alternatives.   Advised to avoid cigarette smoking.  I discussed with the patient that most people either abstain from alcohol or drink within safe limits (<=14/week and <=4 drinks/occasion for males, <=7/weeks and <= 3 drinks/occasion for females) and that the risk for alcohol disorders and other health effects rises proportionally with the number of drinks per week and how often a drinker exceeds daily limits.  Discussed cessation/primary prevention of drug use and availability of treatment for abuse.   Diet: Encouraged to adjust caloric intake to maintain  or achieve ideal body weight, to reduce intake of dietary saturated fat and total fat, to limit sodium intake by avoiding high sodium foods and not adding table salt, and to maintain adequate dietary potassium and calcium preferably from fresh fruits, vegetables, and low-fat dairy products.    stressed the importance of regular exercise  Injury prevention: Discussed safety belts, safety helmets, smoke detector, smoking near bedding or upholstery.   Dental health: Discussed importance of regular tooth brushing, flossing, and dental visits.    NEXT PREVENTATIVE PHYSICAL DUE IN 1 YEAR. Return in about 1 year (around 12/20/2024) for CPE.  Criselda Starke A Diem Dicocco

## 2023-12-21 NOTE — Assessment & Plan Note (Signed)
 Health maintenance reviewed and updated. Discussed nutrition, exercise. Check CMP, CBC today. Follow-up 1 year.

## 2023-12-22 ENCOUNTER — Ambulatory Visit: Payer: Self-pay | Admitting: Nurse Practitioner

## 2023-12-22 LAB — CERVICOVAGINAL ANCILLARY ONLY
Bacterial Vaginitis (gardnerella): NEGATIVE
Candida Glabrata: NEGATIVE
Candida Vaginitis: NEGATIVE
Comment: NEGATIVE
Comment: NEGATIVE
Comment: NEGATIVE
Comment: NEGATIVE
Trichomonas: NEGATIVE

## 2023-12-22 LAB — URINE CULTURE
MICRO NUMBER:: 17102742
SPECIMEN QUALITY:: ADEQUATE

## 2024-02-29 ENCOUNTER — Emergency Department (HOSPITAL_BASED_OUTPATIENT_CLINIC_OR_DEPARTMENT_OTHER)
Admission: EM | Admit: 2024-02-29 | Discharge: 2024-02-29 | Disposition: A | Discharge status: Home / Self Care | Attending: Emergency Medicine | Admitting: Emergency Medicine

## 2024-02-29 ENCOUNTER — Encounter (HOSPITAL_BASED_OUTPATIENT_CLINIC_OR_DEPARTMENT_OTHER): Payer: Self-pay | Admitting: Emergency Medicine

## 2024-02-29 ENCOUNTER — Encounter (HOSPITAL_BASED_OUTPATIENT_CLINIC_OR_DEPARTMENT_OTHER): Payer: Self-pay

## 2024-02-29 ENCOUNTER — Other Ambulatory Visit: Payer: Self-pay

## 2024-02-29 DIAGNOSIS — R112 Nausea with vomiting, unspecified: Secondary | ICD-10-CM | POA: Diagnosis present

## 2024-02-29 DIAGNOSIS — B349 Viral infection, unspecified: Secondary | ICD-10-CM | POA: Insufficient documentation

## 2024-02-29 LAB — RESP PANEL BY RT-PCR (RSV, FLU A&B, COVID)  RVPGX2
Influenza A by PCR: NEGATIVE
Influenza B by PCR: NEGATIVE
Resp Syncytial Virus by PCR: NEGATIVE
SARS Coronavirus 2 by RT PCR: NEGATIVE

## 2024-02-29 LAB — COMPREHENSIVE METABOLIC PANEL WITH GFR
ALT: 21 U/L (ref 0–44)
AST: 21 U/L (ref 15–41)
Albumin: 4.9 g/dL (ref 3.5–5.0)
Alkaline Phosphatase: 70 U/L (ref 38–126)
Anion gap: 14 (ref 5–15)
BUN: 15 mg/dL (ref 6–20)
CO2: 23 mmol/L (ref 22–32)
Calcium: 9.7 mg/dL (ref 8.9–10.3)
Chloride: 103 mmol/L (ref 98–111)
Creatinine, Ser: 0.62 mg/dL (ref 0.44–1.00)
GFR, Estimated: >60 mL/min
Glucose, Bld: 100 mg/dL — ABNORMAL HIGH (ref 70–99)
Potassium: 4.3 mmol/L (ref 3.5–5.1)
Sodium: 140 mmol/L (ref 135–145)
Total Bilirubin: 0.4 mg/dL (ref 0.0–1.2)
Total Protein: 7.9 g/dL (ref 6.5–8.1)

## 2024-02-29 LAB — URINALYSIS, ROUTINE W REFLEX MICROSCOPIC
Bilirubin Urine: NEGATIVE
Glucose, UA: NEGATIVE mg/dL
Ketones, ur: >=80 mg/dL — AB
Leukocytes,Ua: NEGATIVE
Nitrite: NEGATIVE
Protein, ur: 30 mg/dL — AB
Specific Gravity, Urine: 1.015 (ref 1.005–1.030)
pH: 6 (ref 5.0–8.0)

## 2024-02-29 LAB — CBC WITH DIFFERENTIAL/PLATELET
Abs Immature Granulocytes: 0.05 K/uL (ref 0.00–0.07)
Basophils Absolute: 0 K/uL (ref 0.0–0.1)
Basophils Relative: 0 %
Eosinophils Absolute: 0.1 K/uL (ref 0.0–0.5)
Eosinophils Relative: 1 %
HCT: 35.9 % — ABNORMAL LOW (ref 36.0–46.0)
Hemoglobin: 11.6 g/dL — ABNORMAL LOW (ref 12.0–15.0)
Immature Granulocytes: 0 %
Lymphocytes Relative: 10 %
Lymphs Abs: 1.1 K/uL (ref 0.7–4.0)
MCH: 23.4 pg — ABNORMAL LOW (ref 26.0–34.0)
MCHC: 32.3 g/dL (ref 30.0–36.0)
MCV: 72.5 fL — ABNORMAL LOW (ref 80.0–100.0)
Monocytes Absolute: 0.8 K/uL (ref 0.1–1.0)
Monocytes Relative: 7 %
Neutro Abs: 9.3 K/uL — ABNORMAL HIGH (ref 1.7–7.7)
Neutrophils Relative %: 82 %
Platelets: 125 K/uL — ABNORMAL LOW (ref 150–400)
RBC: 4.95 MIL/uL (ref 3.87–5.11)
RDW: 13.4 % (ref 11.5–15.5)
WBC: 11.2 K/uL — ABNORMAL HIGH (ref 4.0–10.5)
nRBC: 0 % (ref 0.0–0.2)

## 2024-02-29 LAB — LIPASE, BLOOD: Lipase: 29 U/L (ref 11–51)

## 2024-02-29 LAB — URINALYSIS, MICROSCOPIC (REFLEX)

## 2024-02-29 LAB — GROUP A STREP BY PCR: Group A Strep by PCR: NOT DETECTED

## 2024-02-29 LAB — HCG, SERUM, QUALITATIVE: Preg, Serum: NEGATIVE

## 2024-02-29 MED ORDER — ACETAMINOPHEN 325 MG PO TABS: 650.0000 mg | ORAL_TABLET | Freq: Once | ORAL | Status: DC

## 2024-02-29 MED ORDER — SODIUM CHLORIDE 0.9 % IV BOLUS
1000.0000 mL | Freq: Once | INTRAVENOUS | Status: AC
  Administered 2024-02-29: 1000 mL via INTRAVENOUS

## 2024-02-29 MED ORDER — ONDANSETRON HCL 4 MG PO TABS: 4.0000 mg | ORAL_TABLET | Freq: Three times a day (TID) | ORAL | 0 refills | Status: AC | PRN

## 2024-02-29 MED ORDER — KETOROLAC TROMETHAMINE 15 MG/ML IJ SOLN
15.0000 mg | Freq: Once | INTRAMUSCULAR | Status: AC
  Administered 2024-02-29: 15 mg via INTRAVENOUS
  Filled 2024-02-29: qty 1

## 2024-02-29 MED ORDER — ACETAMINOPHEN 500 MG PO TABS
1000.0000 mg | ORAL_TABLET | Freq: Once | ORAL | Status: AC
  Administered 2024-02-29: 1000 mg via ORAL
  Filled 2024-02-29: qty 2

## 2024-02-29 MED ORDER — ONDANSETRON HCL 4 MG/2ML IJ SOLN
4.0000 mg | Freq: Once | INTRAMUSCULAR | Status: AC
  Administered 2024-02-29: 4 mg via INTRAVENOUS
  Filled 2024-02-29: qty 2

## 2024-02-29 NOTE — ED Triage Notes (Signed)
 Pt via POV c/o n/v x 48 hours with fatigue. Pt's son was sick recently.

## 2024-02-29 NOTE — Discharge Instructions (Addendum)
 It was a pleasure taking care of you today.  Based on your history and physical exam I feel you are safe for discharge.  Today your symptoms improved with symptomatic treatment while in the emergency department.  I have sent in a prescription for Zofran  which is an anti-nausea and vomiting medication to the pharmacy, please pick it up and use as instructed as needed for nausea and vomiting.  I do recommend that you continue to take Tylenol  and ibuprofen to help with your other symptoms.  I do think the most likely diagnosis at this time is a viral illness leading to your symptoms since your son was recently sick.  If you experience any of the following symptoms including but not limited to refractory fever, chest pain, shortness of breath, abdominal pain, urinary symptoms, pelvic pain, or other concerning symptom please return to the emergency department or seek further medical care.  Please make sure that you are staying hydrated since it appears based on your labs that you were dehydrated today.  Please follow-up with your primary care provider in approximately 1 week, sooner if symptoms warrant. Please make them aware of all work-up and findings. If symptoms worsen recommend follow-up within 48 hours.  You may take up to 1000 mg of Tylenol  in a single dose, please do not exceed 4000 mg in a single day.  You may take up to 6 to 800 mg of ibuprofen in a single dose.

## 2024-02-29 NOTE — ED Notes (Signed)
 Patient aware of necessity of urine sample.

## 2024-02-29 NOTE — ED Notes (Signed)
 Patient transferred from waiting room to ED treatment room. Assuming pt care at this time.

## 2024-02-29 NOTE — ED Provider Notes (Signed)
 " [facility] Provider Note   CSN: [id number] Arrival date & time: 02/29/24  1328     Patient presents with: Emesis   Karina Horne is a 23 y.o. female who presents to the emergency department with a chief complaint of bodyaches, chills, as well as excessive nausea/vomiting for the last 48 hours.  Patient denies known fever, abdominal pain, urinary symptoms.  Patient has a 62-month-old son who was recently sick with similar symptoms last week.  Denies other known sick contacts.  Denies hematemesis. Previous abdominal C-section, with no other abdominal surgeries.  Past medical history significant for iron deficiency anemia on iron.  Patient does appreciate marijuana use however not recently.  Denies abnormal vaginal discharge or pelvic pain.    Emesis      Prior to Admission medications  Medication Sig Start Date End Date Taking? Authorizing Provider  ondansetron  (ZOFRAN ) 4 MG tablet Take 1 tablet (4 mg total) by mouth every 8 (eight) hours as needed for nausea, vomiting or refractory nausea / vomiting. 02/29/24  Yes Lyric Hoar F, PA-C  ferrous sulfate  325 (65 FE) MG EC tablet Take 1 tablet (325 mg total) by mouth daily with breakfast. 03/25/21   Nche, Roselie Rockford, NP  medroxyPROGESTERone  (DEPO-PROVERA ) 150 MG/ML injection Inject 150 mg into the muscle every 3 (three) months.    [provider]    Allergies: Amoxicillin    Review of Systems  Gastrointestinal:  Positive for vomiting.    Updated Vital Signs BP 118/70 (BP Location: Right Arm)   Pulse 65   Temp 98.6 F (37 C) (Oral)   Resp 16   Ht 5' 3 (1.6 m)   Wt 53.5 kg   LMP  (LMP Unknown)   SpO2 99%   BMI 20.90 kg/m   Physical Exam Vitals and nursing note reviewed.  Constitutional:      General: She is awake. She is not in acute distress.    Appearance: Normal appearance. She is not toxic-appearing or diaphoretic.     Comments: Uncomfortable appearing   HENT:     Head: Normocephalic and atraumatic.  Eyes:     General: No scleral icterus. Cardiovascular:     Rate and Rhythm: Normal rate and regular rhythm.  Pulmonary:     Effort: Pulmonary effort is normal. No respiratory distress.     Breath sounds: Normal breath sounds. No stridor. No wheezing, rhonchi or rales.  Abdominal:     General: Abdomen is flat. There is no distension.     Palpations: Abdomen is soft.     Tenderness: There is no abdominal tenderness. There is no right CVA tenderness, left CVA tenderness or guarding.  Musculoskeletal:        General: Normal range of motion.     Right lower leg: No edema.     Left lower leg: No edema.  Skin:    General: Skin is warm.     Capillary Refill: Capillary refill takes less than 2 seconds.  Neurological:     General: No focal deficit present.     Mental Status: She is alert and oriented to person, place, and time.  Psychiatric:        Mood and Affect: Mood normal.        Behavior: Behavior normal. Behavior is cooperative.    (all labs ordered are listed, but only abnormal results are displayed) Labs Reviewed  CBC WITH DIFFERENTIAL/PLATELET - Abnormal; Notable for the following components:  Result Value   WBC 11.2 (*)    Hemoglobin 11.6 (*)    HCT 35.9 (*)    MCV 72.5 (*)    MCH 23.4 (*)    Platelets 125 (*)    Neutro Abs 9.3 (*)    All other components within normal limits  COMPREHENSIVE METABOLIC PANEL WITH GFR - Abnormal; Notable for the following components:   Glucose, Bld 100 (*)    All other components within normal limits  URINALYSIS, ROUTINE W REFLEX MICROSCOPIC - Abnormal; Notable for the following components:   Hgb urine dipstick TRACE (*)    Ketones, ur >=80 (*)    Protein, ur 30 (*)    All other components within normal limits  URINALYSIS, MICROSCOPIC (REFLEX) - Abnormal; Notable for the following components:   Bacteria, UA RARE (*)    All other components within normal limits  RESP PANEL BY RT-PCR  (RSV, FLU A&B, COVID)  [id number]  GROUP A STREP BY PCR  LIPASE, BLOOD  HCG, SERUM, QUALITATIVE    EKG: None  Radiology: No results found.   Procedures   Medications Ordered in the ED  ketorolac  (TORADOL ) 15 MG/ML injection 15 mg (15 mg Intravenous Given 02/29/24 1614)  ondansetron  (ZOFRAN ) injection 4 mg (4 mg Intravenous Given 02/29/24 1614)  sodium chloride  0.9 % bolus 1,000 mL (0 mLs Intravenous Stopped 02/29/24 1720)  acetaminophen  (TYLENOL ) tablet 1,000 mg (1,000 mg Oral Given 02/29/24 1802)                                    Medical Decision Making Amount and/or Complexity of Data Reviewed Labs: ordered.  Risk OTC drugs. Prescription drug management.   Patient presents to the ED for concern of nausea, vomiting, body aches, chills, this involves an extensive number of treatment options, and is a complaint that carries with it a high risk of complications and morbidity.  The differential diagnosis includes viral syndrome, viral gastroenteritis, cannabis hyperemesis syndrome, cholecystitis, pregnancy, pancreatitis, etc.   Co morbidities that complicate the patient evaluation  Iron deficiency anemia   Lab Tests:  I Ordered, and personally interpreted labs.  The pertinent results include:  Strep negative, Respiratory panel negative, CBC significant for mild leukocytosis at 11.2, hemoglobin 11.6, CMP reassuring, lipase not elevated, pregnancy negative, urinalysis significant for trace hemoglobin, greater than 80 ketones, and 30 protein   Medicines ordered and prescription drug management:  I ordered medication including Toradol , tylenol  for pain, Zofran  for nausea, fluids for nausea nad vomiting Reevaluation of the patient after these medicines showed that the patient improved I have reviewed the patients home medicines and have made adjustments as needed   Test Considered:  CT abdomen pelvis: deferred at this time as patient had no focal abdominal tenderness at  time of exam, labs reassuring, patient improved after symptomatic treatment    Critical Interventions:  none   Problem List / ED Course:  23 year old female, vital signs stable, presents emergency department with a chief complaint of fatigue, weakness, as well as excessive nausea and vomiting for the last 48 hours, no fever, son was recently sick with similar symptoms On physical exam patient appears mildly uncomfortable but is well-appearing, abdomen is soft and nondistended, no focal tenderness with abdominal examination, no flank tenderness, patient denies urinary symptoms, no obvious abnormality with auscultation of heart or lungs Suspicious based off history and physical exam for possible viral syndrome, patient also has  history of marijuana use Will treat symptomatically and resuscitate with fluids, will reassess Lab workup today largely reassuring, chronic anemia is present, mild leukocytosis at 11.2 in the setting of vomiting, pregnancy negative, group A strep negative, respiratory panel negative, lipase unremarkable, CMP unremarkable Patient reassessed and symptomatically improved, clinically I do not believe that abdominal imaging is necessary at this time, patient has had no more episodes of vomiting following Zofran  Patient tolerating PO and feeling much better Educated patient on workup and findings, still favor viral syndrome at this time given history and presentation, doubt acute life threatening process, no RLQ pain to indicate appendicitis, no RUQ pain to suggest gallbladder pathology, pregnancy negative, no pelvic pain, labs reassuring Return precautions given Patient discharged   Reevaluation:  After the interventions noted above, I reevaluated the patient and found that they have :improved   Social Determinants of Health:  none   Dispostion:  After consideration of the diagnostic results and the patients response to treatment, I feel that the patent would  benefit from discharge and outpatient therapy as described, continued monitoring of symptoms, follow-up with PCP.      Final diagnoses:  Nausea and vomiting, unspecified vomiting type  Acute viral syndrome    ED Discharge Orders          Ordered    ondansetron  (ZOFRAN ) 4 MG tablet  Every 8 hours PRN        02/29/24 1847               Camarie Mctigue F, PA-C 02/29/24 2249  "

## 2024-12-25 ENCOUNTER — Encounter: Admitting: Nurse Practitioner
